# Patient Record
Sex: Female | Born: 1970 | Race: Black or African American | Hispanic: No | Marital: Single | Smoking: Never smoker
Health system: Southern US, Community
[De-identification: ages and names within clinical notes are randomized; demographics above are authoritative.]

## PROBLEM LIST (undated history)

## (undated) DIAGNOSIS — R519 Headache, unspecified: Secondary | ICD-10-CM

## (undated) DIAGNOSIS — D649 Anemia, unspecified: Secondary | ICD-10-CM

## (undated) DIAGNOSIS — R51 Headache: Secondary | ICD-10-CM

## (undated) DIAGNOSIS — J45909 Unspecified asthma, uncomplicated: Secondary | ICD-10-CM

## (undated) DIAGNOSIS — D219 Benign neoplasm of connective and other soft tissue, unspecified: Secondary | ICD-10-CM

## (undated) DIAGNOSIS — F419 Anxiety disorder, unspecified: Secondary | ICD-10-CM

## (undated) DIAGNOSIS — S4990XA Unspecified injury of shoulder and upper arm, unspecified arm, initial encounter: Secondary | ICD-10-CM

## (undated) HISTORY — DX: Benign neoplasm of connective and other soft tissue, unspecified: D21.9

## (undated) HISTORY — DX: Unspecified asthma, uncomplicated: J45.909

## (undated) HISTORY — PX: WISDOM TOOTH EXTRACTION: SHX21

---

## 1998-02-19 ENCOUNTER — Encounter: Admission: RE | Admit: 1998-02-19 | Discharge: 1998-02-19 | Payer: Self-pay | Admitting: *Deleted

## 1999-06-24 ENCOUNTER — Encounter: Payer: Self-pay | Admitting: Emergency Medicine

## 1999-06-24 ENCOUNTER — Emergency Department (HOSPITAL_COMMUNITY): Admission: EM | Admit: 1999-06-24 | Discharge: 1999-06-24 | Payer: Self-pay | Admitting: Emergency Medicine

## 1999-09-06 ENCOUNTER — Emergency Department (HOSPITAL_COMMUNITY): Admission: EM | Admit: 1999-09-06 | Discharge: 1999-09-06 | Payer: Self-pay | Admitting: Emergency Medicine

## 2000-08-22 ENCOUNTER — Emergency Department (HOSPITAL_COMMUNITY): Admission: EM | Admit: 2000-08-22 | Discharge: 2000-08-23 | Payer: Self-pay | Admitting: Emergency Medicine

## 2000-08-23 ENCOUNTER — Encounter: Payer: Self-pay | Admitting: Emergency Medicine

## 2001-05-15 ENCOUNTER — Encounter: Payer: Self-pay | Admitting: Emergency Medicine

## 2001-05-15 ENCOUNTER — Encounter: Admission: RE | Admit: 2001-05-15 | Discharge: 2001-05-15 | Payer: Self-pay | Admitting: Emergency Medicine

## 2002-06-13 ENCOUNTER — Other Ambulatory Visit: Admission: RE | Admit: 2002-06-13 | Discharge: 2002-06-13 | Payer: Self-pay | Admitting: *Deleted

## 2005-04-22 ENCOUNTER — Other Ambulatory Visit: Admission: RE | Admit: 2005-04-22 | Discharge: 2005-04-22 | Payer: Self-pay | Admitting: Obstetrics & Gynecology

## 2005-07-04 ENCOUNTER — Ambulatory Visit: Payer: Self-pay | Admitting: Family Medicine

## 2005-10-05 ENCOUNTER — Ambulatory Visit: Payer: Self-pay | Admitting: Family Medicine

## 2006-02-14 ENCOUNTER — Ambulatory Visit: Payer: Self-pay | Admitting: Family Medicine

## 2006-02-22 ENCOUNTER — Ambulatory Visit: Payer: Self-pay | Admitting: Family Medicine

## 2006-03-21 ENCOUNTER — Ambulatory Visit: Payer: Self-pay | Admitting: Family Medicine

## 2006-05-09 ENCOUNTER — Ambulatory Visit: Payer: Self-pay | Admitting: Family Medicine

## 2006-06-16 ENCOUNTER — Ambulatory Visit: Payer: Self-pay | Admitting: Family Medicine

## 2006-08-08 ENCOUNTER — Ambulatory Visit: Payer: Self-pay | Admitting: Family Medicine

## 2006-08-16 ENCOUNTER — Ambulatory Visit: Payer: Self-pay | Admitting: Family Medicine

## 2006-08-25 ENCOUNTER — Telehealth (INDEPENDENT_AMBULATORY_CARE_PROVIDER_SITE_OTHER): Payer: Self-pay | Admitting: *Deleted

## 2006-08-28 ENCOUNTER — Ambulatory Visit: Payer: Self-pay | Admitting: Family Medicine

## 2006-08-28 DIAGNOSIS — F329 Major depressive disorder, single episode, unspecified: Secondary | ICD-10-CM | POA: Insufficient documentation

## 2006-08-28 DIAGNOSIS — F411 Generalized anxiety disorder: Secondary | ICD-10-CM | POA: Insufficient documentation

## 2006-09-21 ENCOUNTER — Telehealth (INDEPENDENT_AMBULATORY_CARE_PROVIDER_SITE_OTHER): Payer: Self-pay | Admitting: Internal Medicine

## 2006-09-26 ENCOUNTER — Emergency Department: Payer: Self-pay | Admitting: Emergency Medicine

## 2006-09-26 ENCOUNTER — Other Ambulatory Visit: Payer: Self-pay

## 2006-09-29 ENCOUNTER — Ambulatory Visit: Payer: Self-pay | Admitting: Internal Medicine

## 2006-10-23 ENCOUNTER — Encounter: Payer: Self-pay | Admitting: Internal Medicine

## 2006-10-23 DIAGNOSIS — G5600 Carpal tunnel syndrome, unspecified upper limb: Secondary | ICD-10-CM | POA: Insufficient documentation

## 2006-11-15 ENCOUNTER — Encounter (INDEPENDENT_AMBULATORY_CARE_PROVIDER_SITE_OTHER): Payer: Self-pay | Admitting: Internal Medicine

## 2006-11-15 ENCOUNTER — Ambulatory Visit: Payer: Self-pay | Admitting: Family Medicine

## 2007-06-04 ENCOUNTER — Telehealth (INDEPENDENT_AMBULATORY_CARE_PROVIDER_SITE_OTHER): Payer: Self-pay | Admitting: Internal Medicine

## 2008-04-08 ENCOUNTER — Encounter (INDEPENDENT_AMBULATORY_CARE_PROVIDER_SITE_OTHER): Payer: Self-pay | Admitting: *Deleted

## 2009-04-25 HISTORY — PX: MYOMECTOMY ABDOMINAL APPROACH: SUR870

## 2009-05-15 ENCOUNTER — Emergency Department (HOSPITAL_COMMUNITY): Admission: EM | Admit: 2009-05-15 | Discharge: 2009-05-16 | Payer: Self-pay | Admitting: Emergency Medicine

## 2009-10-16 ENCOUNTER — Ambulatory Visit: Payer: Self-pay | Admitting: Obstetrics & Gynecology

## 2009-10-16 ENCOUNTER — Encounter (INDEPENDENT_AMBULATORY_CARE_PROVIDER_SITE_OTHER): Payer: Self-pay | Admitting: *Deleted

## 2009-10-16 LAB — CONVERTED CEMR LAB
CA 125: 15.5 units/mL (ref 0.0–30.2)
Pap Smear: NEGATIVE

## 2009-10-29 ENCOUNTER — Ambulatory Visit: Admission: RE | Admit: 2009-10-29 | Discharge: 2009-10-29 | Payer: Self-pay | Admitting: Gynecologic Oncology

## 2009-12-01 ENCOUNTER — Encounter: Payer: Self-pay | Admitting: Obstetrics & Gynecology

## 2009-12-01 ENCOUNTER — Inpatient Hospital Stay (HOSPITAL_COMMUNITY): Admission: RE | Admit: 2009-12-01 | Discharge: 2009-12-03 | Payer: Self-pay | Admitting: Obstetrics & Gynecology

## 2009-12-31 ENCOUNTER — Ambulatory Visit: Admission: RE | Admit: 2009-12-31 | Discharge: 2009-12-31 | Payer: Self-pay | Admitting: Gynecologic Oncology

## 2010-04-01 ENCOUNTER — Emergency Department (HOSPITAL_COMMUNITY): Admission: EM | Admit: 2010-04-01 | Discharge: 2009-09-12 | Payer: Self-pay | Admitting: Emergency Medicine

## 2010-07-09 LAB — TYPE AND SCREEN
ABO/RH(D): B POS
Antibody Screen: NEGATIVE

## 2010-07-09 LAB — COMPREHENSIVE METABOLIC PANEL
ALT: 19 U/L (ref 0–35)
Alkaline Phosphatase: 53 U/L (ref 39–117)
CO2: 24 mEq/L (ref 19–32)
Chloride: 108 mEq/L (ref 96–112)
Glucose, Bld: 93 mg/dL (ref 70–99)
Potassium: 4 mEq/L (ref 3.5–5.1)
Sodium: 139 mEq/L (ref 135–145)

## 2010-07-09 LAB — DIFFERENTIAL
Basophils Relative: 0 % (ref 0–1)
Eosinophils Relative: 3 % (ref 0–5)
Lymphocytes Relative: 41 % (ref 12–46)
Lymphs Abs: 3.6 10*3/uL (ref 0.7–4.0)
Lymphs Abs: 3.7 10*3/uL (ref 0.7–4.0)
Monocytes Relative: 5 % (ref 3–12)
Monocytes Relative: 6 % (ref 3–12)
Neutro Abs: 4.5 10*3/uL (ref 1.7–7.7)
Neutro Abs: 7.5 10*3/uL (ref 1.7–7.7)
Neutrophils Relative %: 61 % (ref 43–77)

## 2010-07-09 LAB — CA 125: CA 125: 12 U/mL (ref 0.0–30.2)

## 2010-07-09 LAB — CBC
HCT: 34.3 % — ABNORMAL LOW (ref 36.0–46.0)
Hemoglobin: 11.9 g/dL — ABNORMAL LOW (ref 12.0–15.0)
MCH: 29.2 pg (ref 26.0–34.0)
MCHC: 34.7 g/dL (ref 30.0–36.0)
MCHC: 34.8 g/dL (ref 30.0–36.0)
MCV: 84.9 fL (ref 78.0–100.0)
Platelets: 273 10*3/uL (ref 150–400)
Platelets: 289 10*3/uL (ref 150–400)
RDW: 14.7 % (ref 11.5–15.5)
WBC: 12.3 10*3/uL — ABNORMAL HIGH (ref 4.0–10.5)
WBC: 17 10*3/uL — ABNORMAL HIGH (ref 4.0–10.5)

## 2010-07-09 LAB — SURGICAL PCR SCREEN: MRSA, PCR: NEGATIVE

## 2010-07-09 LAB — PREGNANCY, URINE: Preg Test, Ur: NEGATIVE

## 2010-07-12 LAB — CBC
HCT: 38.6 % (ref 36.0–46.0)
Hemoglobin: 13.3 g/dL (ref 12.0–15.0)
MCHC: 34.6 g/dL (ref 30.0–36.0)
MCV: 85.2 fL (ref 78.0–100.0)
Platelets: 344 10*3/uL (ref 150–400)
RBC: 4.52 MIL/uL (ref 3.87–5.11)

## 2010-07-12 LAB — COMPREHENSIVE METABOLIC PANEL
Albumin: 3.3 g/dL — ABNORMAL LOW (ref 3.5–5.2)
BUN: 6 mg/dL (ref 6–23)
CO2: 24 mEq/L (ref 19–32)
Chloride: 107 mEq/L (ref 96–112)
GFR calc Af Amer: >60 mL/min (ref >60)
Glucose, Bld: 152 mg/dL — ABNORMAL HIGH (ref 70–99)
Potassium: 3.5 mEq/L (ref 3.5–5.1)

## 2010-07-12 LAB — URINALYSIS, ROUTINE W REFLEX MICROSCOPIC
Bilirubin Urine: NEGATIVE
Ketones, ur: NEGATIVE mg/dL
Leukocytes, UA: NEGATIVE
Nitrite: NEGATIVE
Urobilinogen, UA: 0.2 mg/dL (ref 0.0–1.0)
pH: 6.5 (ref 5.0–8.0)

## 2010-07-12 LAB — DIFFERENTIAL
Basophils Absolute: 0.1 10*3/uL (ref 0.0–0.1)
Eosinophils Absolute: 0.1 10*3/uL (ref 0.0–0.7)
Lymphs Abs: 2.9 10*3/uL (ref 0.7–4.0)
Monocytes Absolute: 1.2 10*3/uL — ABNORMAL HIGH (ref 0.1–1.0)
Neutro Abs: 13.7 10*3/uL — ABNORMAL HIGH (ref 1.7–7.7)
Neutrophils Relative %: 76 % (ref 43–77)

## 2010-07-12 LAB — URINE MICROSCOPIC-ADD ON

## 2010-10-10 IMAGING — CR DG CHEST 2V
2 series · 2 of 2 positions shown · non-contrast
Comparison: [HOSPITAL] chest x-ray report 08/23/2000.

CLINICAL DATA: Preop respiratory exam for pelvic mass.

CHEST - 2 VIEW

[w chest pa]
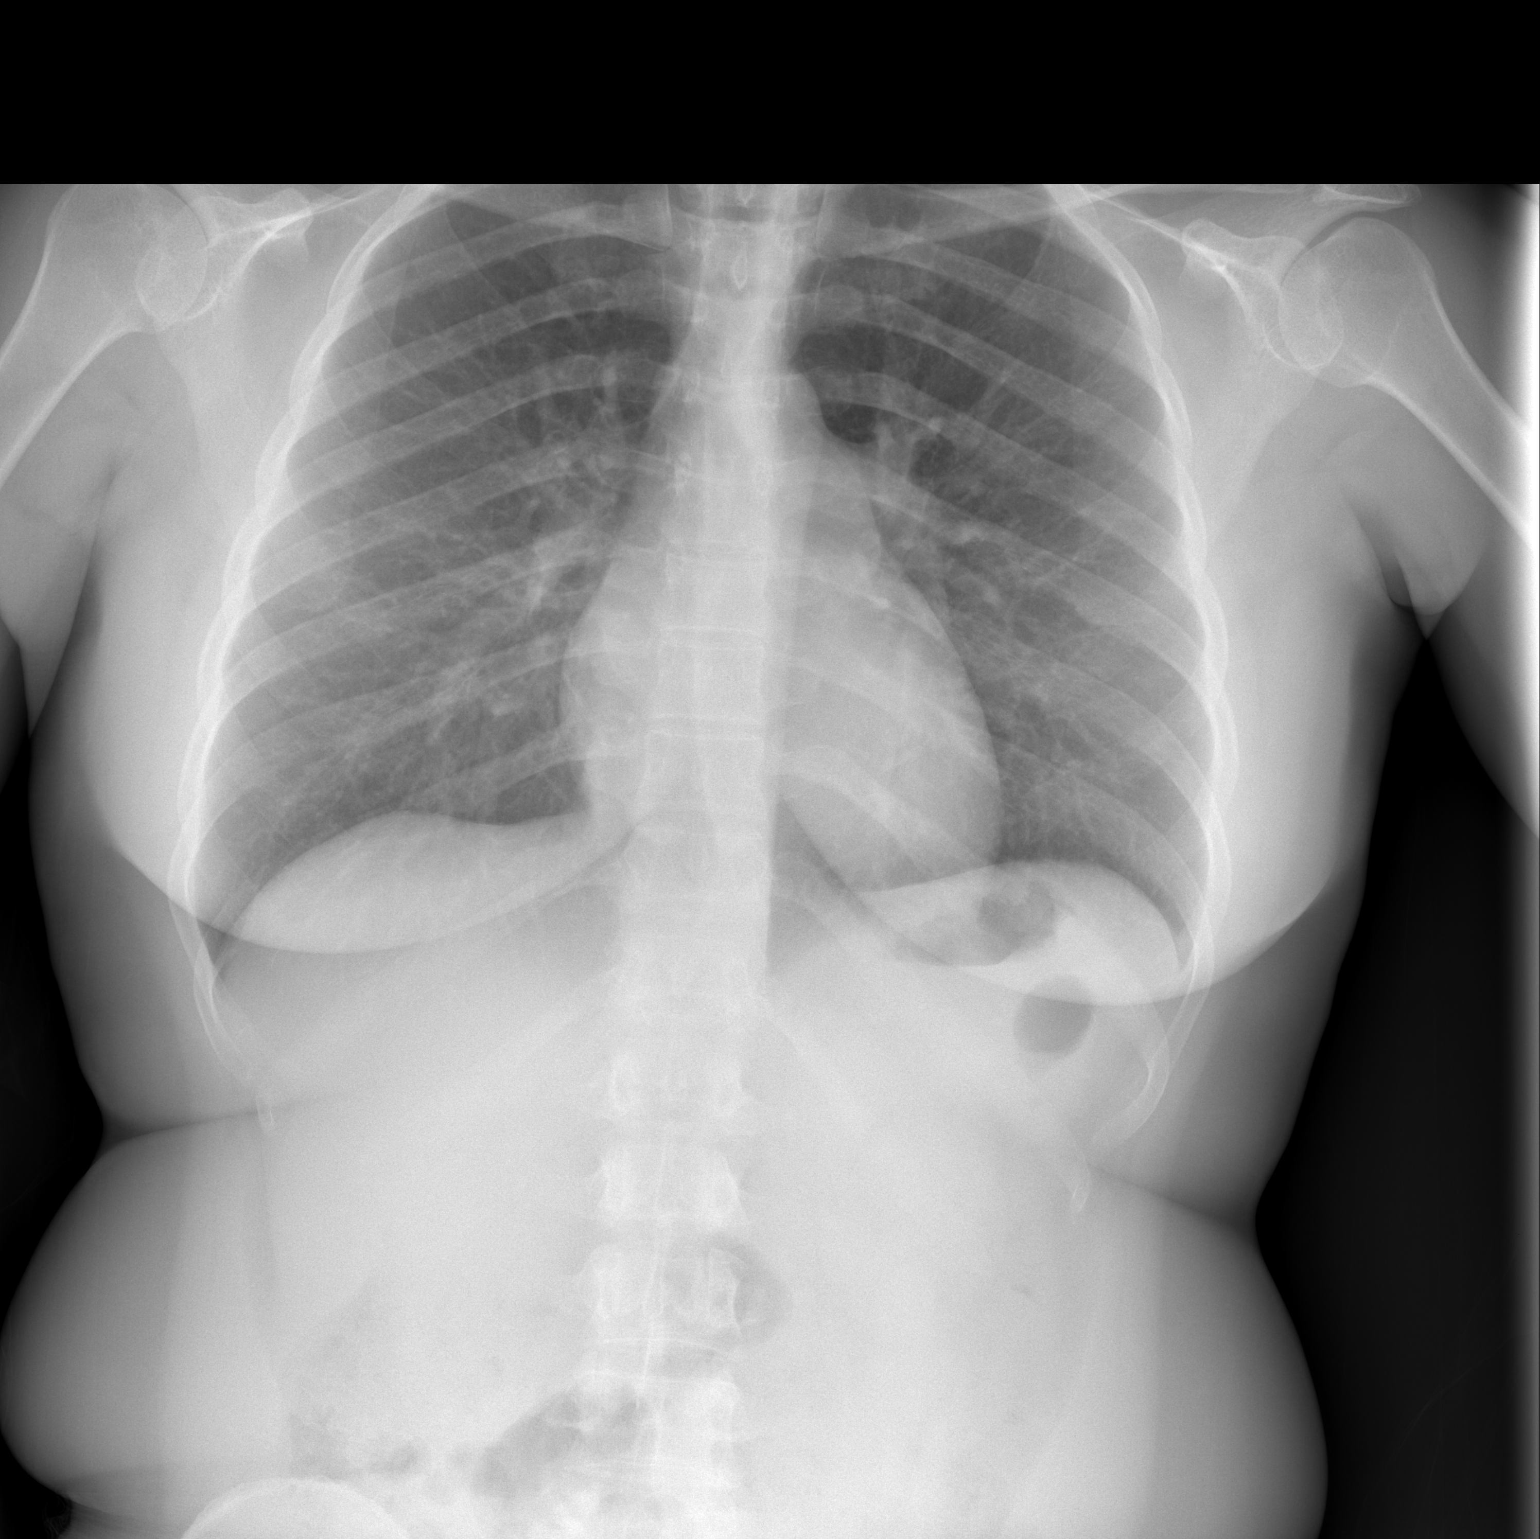

[w chest lat]
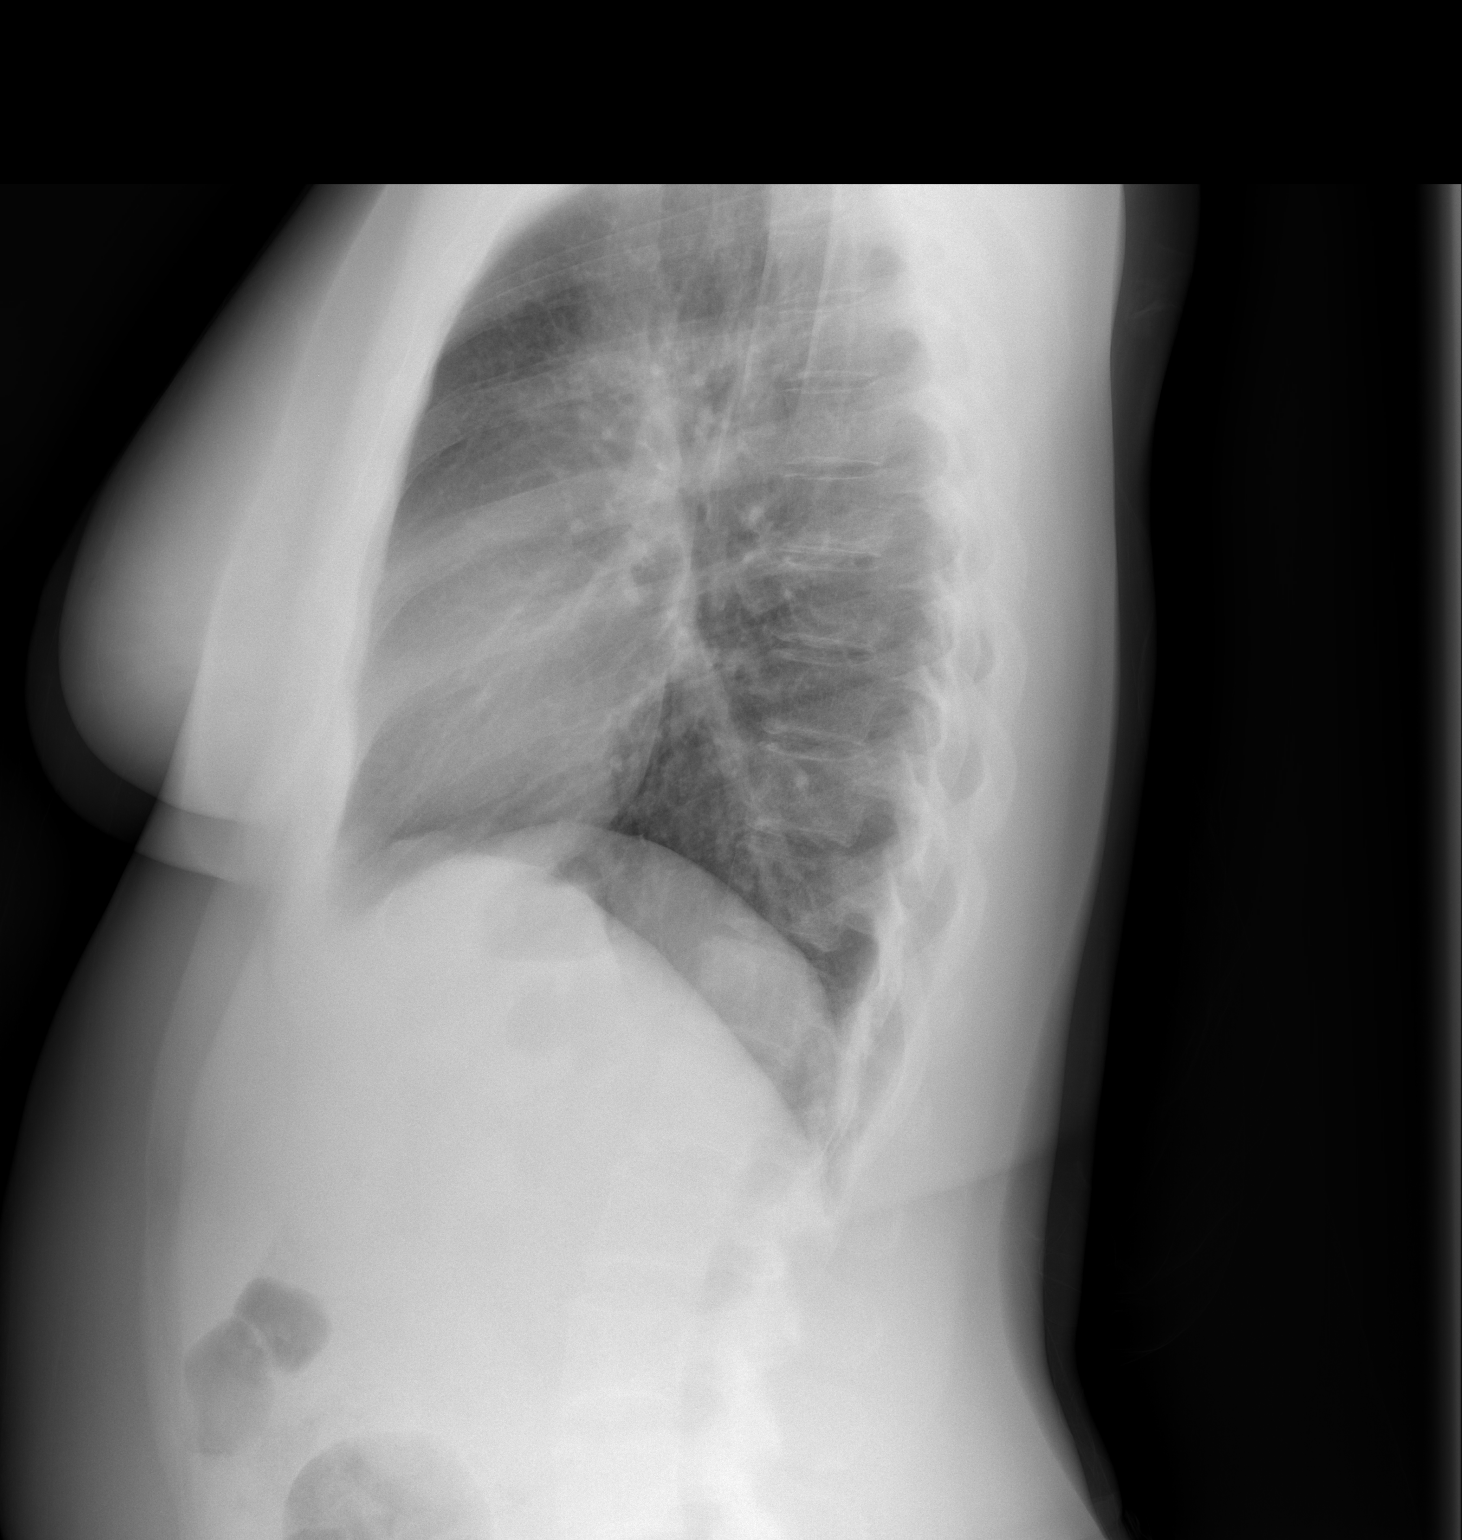

[2 of 2 positions shown; findings below may reference images not displayed]

FINDINGS: Lungs are clear.  Heart size normal.  Mediastinum, hila,
pleura and osseous structures appear normal.
IMPRESSION: Normal.

## 2011-01-16 ENCOUNTER — Emergency Department (HOSPITAL_COMMUNITY)
Admission: EM | Admit: 2011-01-16 | Discharge: 2011-01-16 | Disposition: A | Discharge status: Home / Self Care | Payer: Self-pay | Attending: Emergency Medicine | Admitting: Emergency Medicine

## 2011-01-16 DIAGNOSIS — W57XXXA Bitten or stung by nonvenomous insect and other nonvenomous arthropods, initial encounter: Secondary | ICD-10-CM | POA: Insufficient documentation

## 2011-01-16 DIAGNOSIS — T148 Other injury of unspecified body region: Secondary | ICD-10-CM | POA: Insufficient documentation

## 2011-01-16 DIAGNOSIS — L2989 Other pruritus: Secondary | ICD-10-CM | POA: Insufficient documentation

## 2011-01-16 DIAGNOSIS — L298 Other pruritus: Secondary | ICD-10-CM | POA: Insufficient documentation

## 2011-05-12 ENCOUNTER — Ambulatory Visit: Payer: Self-pay

## 2011-11-28 ENCOUNTER — Emergency Department (HOSPITAL_COMMUNITY): Payer: Self-pay

## 2011-11-28 ENCOUNTER — Encounter (HOSPITAL_COMMUNITY): Payer: Self-pay | Admitting: *Deleted

## 2011-11-28 ENCOUNTER — Emergency Department (HOSPITAL_COMMUNITY)
Admission: EM | Admit: 2011-11-28 | Discharge: 2011-11-28 | Disposition: A | Discharge status: Home Health | Payer: Self-pay | Attending: Emergency Medicine | Admitting: Emergency Medicine

## 2011-11-28 DIAGNOSIS — J40 Bronchitis, not specified as acute or chronic: Secondary | ICD-10-CM | POA: Insufficient documentation

## 2011-11-28 MED ORDER — HYDROCOD POLST-CHLORPHEN POLST 10-8 MG/5ML PO LQCR: 5.0000 mL | Freq: Every evening | ORAL | Status: DC | PRN

## 2011-11-28 MED ORDER — AZITHROMYCIN 250 MG PO TABS: 250.0000 mg | ORAL_TABLET | Freq: Every day | ORAL | Status: AC

## 2011-11-28 NOTE — ED Provider Notes (Signed)
History   This chart was scribed for Debra Bucco, MD by Shari Heritage. The patient was seen in room TR08C/TR08C. Patient's care was started at 1152.     CSN: [id number]  Arrival date & time 11/28/11  1152   First MD Initiated Contact with Patient 11/28/11 1338      Chief Complaint  Patient presents with  . Cough    (Consider location/radiation/quality/duration/timing/severity/associated sxs/prior treatment) The history is provided by the patient. No language interpreter was used.   Debra Hicks is a 41 y.o. Female` who presents to the Emergency Department complaining of persistent, cough with associated mild chest pain, wheezing, and rhinorrhea onset 2 weeks ago. Patient denies nausea, vomiting, diarrhea, urinary incontinence, or abdominal pain. Patient has taken Tylenol and Benadryl for symptoms with minimal relief. Patient has a surgical history of uterine fibroid surgery. She reports no other significant medical history. She has never smoked. Cough is dry, but feels rattling in chest.  No pleuritic pain.   Past Surgical History  Procedure Date  . Uterine fibroid surgery     No family history on file.  History  Substance Use Topics  . Smoking status: Never Smoker   . Smokeless tobacco: Not on file  . Alcohol Use: No    OB History    Grav Para Term Preterm Abortions TAB SAB Ect Mult Living                  Review of Systems  Constitutional: Positive for fatigue. Negative for fever, chills and diaphoresis.  HENT: Positive for congestion and rhinorrhea. Negative for sneezing.   Eyes: Negative.   Respiratory: Positive for cough, shortness of breath and wheezing. Negative for chest tightness.   Cardiovascular: Positive for chest pain. Negative for leg swelling.  Gastrointestinal: Negative for nausea, vomiting, abdominal pain, diarrhea and blood in stool.  Genitourinary: Negative for frequency, hematuria, flank pain and difficulty urinating.  Musculoskeletal:  Negative for back pain and arthralgias.  Skin: Negative for rash.  Neurological: Negative for dizziness, speech difficulty, weakness, numbness and headaches.    Allergies  Review of patient's allergies indicates no known allergies.  Home Medications   Current Outpatient Rx  Name Route Sig Dispense Refill  . DIPHENHYDRAMINE HCL 25 MG PO CAPS Oral Take 25 mg by mouth every 6 (six) hours as needed. For allergies    . TYLENOL COLD PO Oral Take 1 capsule by mouth 2 (two) times daily as needed. For cold symptoms    . AZITHROMYCIN 250 MG PO TABS Oral Take 1 tablet (250 mg total) by mouth daily. Take first 2 tablets together, then 1 every day until finished. 6 tablet 0  . HYDROCOD POLST-CPM POLST ER 10-8 MG/5ML PO LQCR Oral Take 5 mLs by mouth at bedtime as needed. 115 mL 0    BP 126/90  Pulse 73  Temp 98.8 F (37.1 C) (Oral)  Resp 18  SpO2 99%  LMP 10/31/2011  Physical Exam  Constitutional: She is oriented to person, place, and time. She appears well-developed and well-nourished.  HENT:  Head: Normocephalic and atraumatic.  Right Ear: External ear normal.  Left Ear: External ear normal.  Mouth/Throat: Oropharynx is clear and moist.  Eyes: Pupils are equal, round, and reactive to light.  Neck: Normal range of motion. Neck supple.  Cardiovascular: Normal rate, regular rhythm and normal heart sounds.   Pulmonary/Chest: Effort normal and breath sounds normal. No respiratory distress. She has no wheezes. She has no rales. She exhibits no  tenderness.  Abdominal: Soft. Bowel sounds are normal. There is no tenderness. There is no rebound and no guarding.  Musculoskeletal: Normal range of motion. She exhibits no edema.  Lymphadenopathy:    She has no cervical adenopathy.  Neurological: She is alert and oriented to person, place, and time.  Skin: Skin is warm and dry. No rash noted.  Psychiatric: She has a normal mood and affect.    ED Course  Procedures (including critical care  time) DIAGNOSTIC STUDIES: Oxygen Saturation is 99% on room air, normal by my interpretation.    COORDINATION OF CARE: 1:43pm- Patient informed of current plan for treatment and evaluation and agrees with plan at this time.   Dg Chest 2 View  11/28/2011  *RADIOLOGY REPORT*  Clinical Data: Cough, wheezing, chest congestion.  CHEST - 2 VIEW  Comparison: 11/27/2009  Findings: Heart and mediastinal contours are within normal limits. No focal opacities or effusions.  No acute bony abnormality.  IMPRESSION: No active cardiopulmonary disease.  Original Report Authenticated By: Cyndie Chime, M.D.    1. Bronchitis       MDM  Will start on zithromax, tussionex cough syrup.  F/u with PCP is symptoms not improving.      I personally performed the services described in this documentation, which was scribed in my presence.  The recorded information has been reviewed and considered.    Debra Bucco, MD 11/28/11 [phone]

## 2011-11-28 NOTE — ED Notes (Signed)
Pt is here with wheezing and coughing and states breathing distorted.  Pt in nad

## 2011-12-19 ENCOUNTER — Emergency Department (HOSPITAL_COMMUNITY)
Admission: EM | Admit: 2011-12-19 | Discharge: 2011-12-19 | Disposition: A | Discharge status: Home / Self Care | Payer: No Typology Code available for payment source | Attending: Emergency Medicine | Admitting: Emergency Medicine

## 2011-12-19 ENCOUNTER — Encounter (HOSPITAL_COMMUNITY): Payer: Self-pay

## 2011-12-19 ENCOUNTER — Emergency Department (HOSPITAL_COMMUNITY): Payer: No Typology Code available for payment source

## 2011-12-19 DIAGNOSIS — S139XXA Sprain of joints and ligaments of unspecified parts of neck, initial encounter: Secondary | ICD-10-CM

## 2011-12-19 DIAGNOSIS — Z043 Encounter for examination and observation following other accident: Secondary | ICD-10-CM | POA: Insufficient documentation

## 2011-12-19 MED ORDER — IBUPROFEN 800 MG PO TABS
800.0000 mg | ORAL_TABLET | Freq: Once | ORAL | Status: AC
  Administered 2011-12-19: 800 mg via ORAL
  Filled 2011-12-19: qty 1

## 2011-12-19 MED ORDER — OXYCODONE-ACETAMINOPHEN 5-325 MG PO TABS: 2.0000 | ORAL_TABLET | ORAL | Status: AC | PRN

## 2011-12-19 MED ORDER — OXYCODONE-ACETAMINOPHEN 5-325 MG PO TABS
2.0000 | ORAL_TABLET | Freq: Once | ORAL | Status: AC
  Administered 2011-12-19: 2 via ORAL
  Filled 2011-12-19: qty 2

## 2011-12-19 MED ORDER — IBUPROFEN 600 MG PO TABS: 600.0000 mg | ORAL_TABLET | Freq: Four times a day (QID) | ORAL | Status: AC | PRN
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
## 2011-12-27 ENCOUNTER — Emergency Department (HOSPITAL_COMMUNITY)
Admission: EM | Admit: 2011-12-27 | Discharge: 2011-12-27 | Disposition: A | Discharge status: Home / Self Care | Payer: No Typology Code available for payment source | Attending: Emergency Medicine | Admitting: Emergency Medicine

## 2011-12-27 ENCOUNTER — Encounter (HOSPITAL_COMMUNITY): Payer: Self-pay | Admitting: Emergency Medicine

## 2011-12-27 DIAGNOSIS — Z0279 Encounter for issue of other medical certificate: Secondary | ICD-10-CM | POA: Insufficient documentation

## 2011-12-27 DIAGNOSIS — R51 Headache: Secondary | ICD-10-CM | POA: Insufficient documentation

## 2011-12-27 DIAGNOSIS — M542 Cervicalgia: Secondary | ICD-10-CM | POA: Insufficient documentation

## 2011-12-27 DIAGNOSIS — T148XXA Other injury of unspecified body region, initial encounter: Secondary | ICD-10-CM

## 2011-12-27 DIAGNOSIS — Z760 Encounter for issue of repeat prescription: Secondary | ICD-10-CM | POA: Insufficient documentation

## 2011-12-27 MED ORDER — NAPROXEN 250 MG PO TABS: 250.0000 mg | ORAL_TABLET | Freq: Two times a day (BID) | ORAL | Status: DC

## 2011-12-27 MED ORDER — METHOCARBAMOL 500 MG PO TABS: 1000.0000 mg | ORAL_TABLET | Freq: Four times a day (QID) | ORAL | Status: AC | PRN

## 2011-12-27 MED ORDER — OXYCODONE-ACETAMINOPHEN 5-325 MG PO TABS: ORAL_TABLET | ORAL | Status: AC
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
## 2011-12-27 NOTE — ED Notes (Signed)
Patient wants to leave.  States that she does not want to wait for her CT scan even though she is next on the list.  MD notified.

## 2012-11-18 ENCOUNTER — Encounter (HOSPITAL_COMMUNITY): Payer: Self-pay | Admitting: Emergency Medicine

## 2012-11-18 ENCOUNTER — Emergency Department (HOSPITAL_COMMUNITY)
Admission: EM | Admit: 2012-11-18 | Discharge: 2012-11-18 | Disposition: A | Discharge status: Home / Self Care | Payer: Self-pay | Attending: Emergency Medicine | Admitting: Emergency Medicine

## 2012-11-18 DIAGNOSIS — M2540 Effusion, unspecified joint: Secondary | ICD-10-CM | POA: Insufficient documentation

## 2012-11-18 DIAGNOSIS — M25519 Pain in unspecified shoulder: Secondary | ICD-10-CM | POA: Insufficient documentation

## 2012-11-18 DIAGNOSIS — M7989 Other specified soft tissue disorders: Secondary | ICD-10-CM

## 2012-11-18 DIAGNOSIS — M25561 Pain in right knee: Secondary | ICD-10-CM

## 2012-11-18 DIAGNOSIS — M79609 Pain in unspecified limb: Secondary | ICD-10-CM

## 2012-11-18 MED ORDER — TRAMADOL HCL 50 MG PO TABS: 50.0000 mg | ORAL_TABLET | Freq: Four times a day (QID) | ORAL | Status: DC | PRN

## 2012-11-18 MED ORDER — HYDROCODONE-ACETAMINOPHEN 5-325 MG PO TABS
2.0000 | ORAL_TABLET | Freq: Once | ORAL | Status: AC
  Administered 2012-11-18: 2 via ORAL
  Filled 2012-11-18: qty 2

## 2012-11-18 NOTE — Progress Notes (Signed)
Orthopedic Tech Progress Note Patient Details:  Debra Hicks Sep 20, 1970 [id number]  Ortho Devices Type of Ortho Device: Crutches;Knee Sleeve Ortho Device/Splint Interventions: Application   Cammer, Mickie Bail 11/18/2012, 12:45 PM

## 2012-11-18 NOTE — ED Notes (Signed)
Pt c/o right leg pain x 3 days. Pt denies injury, traveling or change in activity.

## 2012-11-18 NOTE — ED Provider Notes (Signed)
Medical screening examination/treatment/procedure(s) were performed by non-physician practitioner and as supervising physician I was immediately available for consultation/collaboration.   Lyanne Co, MD 11/18/12 1230
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
## 2012-11-18 NOTE — Progress Notes (Signed)
VASCULAR LAB PRELIMINARY  PRELIMINARY  PRELIMINARY  PRELIMINARY  Right lower extremity venous Doppler completed.    Preliminary report:  There is no DVT or SVT noted in the right lower extremity.  Skyylar Kopf, RVT 11/18/2012, 12:20 PM

## 2012-11-24 ENCOUNTER — Emergency Department (HOSPITAL_COMMUNITY)
Admission: EM | Admit: 2012-11-24 | Discharge: 2012-11-24 | Disposition: A | Discharge status: Home / Self Care | Payer: Self-pay | Attending: Emergency Medicine | Admitting: Emergency Medicine

## 2012-11-24 ENCOUNTER — Emergency Department (HOSPITAL_COMMUNITY): Payer: Self-pay

## 2012-11-24 ENCOUNTER — Encounter (HOSPITAL_COMMUNITY): Payer: Self-pay | Admitting: Emergency Medicine

## 2012-11-24 DIAGNOSIS — M25561 Pain in right knee: Secondary | ICD-10-CM

## 2012-11-24 DIAGNOSIS — M25569 Pain in unspecified knee: Secondary | ICD-10-CM | POA: Insufficient documentation

## 2012-11-24 DIAGNOSIS — M7989 Other specified soft tissue disorders: Secondary | ICD-10-CM | POA: Insufficient documentation

## 2012-11-24 MED ORDER — TRAMADOL HCL 50 MG PO TABS: 50.0000 mg | ORAL_TABLET | Freq: Four times a day (QID) | ORAL | Status: DC | PRN

## 2012-11-24 NOTE — ED Provider Notes (Signed)
CSN: [id number]     Arrival date & time 11/24/12  0825 History     First MD Initiated Contact with Patient 11/24/12 0830     Chief Complaint  Patient presents with  . Leg Swelling  . Leg Pain   (Consider location/radiation/quality/duration/timing/severity/associated sxs/prior Treatment) HPI Comments: 42 year old female presents to the emergency department complaining of right knee pain continuing after being seen in the emergency department 6 days ago. At that time she had a Doppler ultrasound to rule out a DVT, ultrasound negative for DVT. She was given a knee sleeve and crutches and advised to take ibuprofen, however she has not had any relief. She was also advised to followup with orthopedics, however states her insurance company will not cover of this at this time, she is working to get this issue fixed. States her knee is more swollen and feels "pulling" behind her knee. Pain rated 8/10, has been taking tramadol, flexeril and ibuprofen with minimal relief. Denies fever, chills, color changes.  Patient is a 42 y.o. female presenting with leg pain. The history is provided by the patient.  Leg Pain Associated symptoms: no fever     History reviewed. No pertinent past medical history. Past Surgical History  Procedure Laterality Date  . Uterine fibroid surgery     No family history on file. History  Substance Use Topics  . Smoking status: Never Smoker   . Smokeless tobacco: Not on file  . Alcohol Use: No   OB History   Grav Para Term Preterm Abortions TAB SAB Ect Mult Living                 Review of Systems  Constitutional: Negative for fever and chills.  Musculoskeletal: Positive for joint swelling.       Positive for right knee pain.  Skin: Negative for color change.  All other systems reviewed and are negative.    Allergies  Review of patient's allergies indicates no known allergies.  Home Medications   Current Outpatient Rx  Name  Route  Sig  Dispense  Refill   . ALPRAZolam (XANAX) 1 MG tablet   Oral   Take 1 mg by mouth daily as needed for anxiety.         Marland Kitchen oxyCODONE-acetaminophen (PERCOCET/ROXICET) 5-325 MG per tablet   Oral   Take 1 tablet by mouth every 4 (four) hours as needed for pain.         . traMADol (ULTRAM) 50 MG tablet   Oral   Take 1 tablet (50 mg total) by mouth every 6 (six) hours as needed for pain.   15 tablet   0   . zolpidem (AMBIEN) 10 MG tablet   Oral   Take 10 mg by mouth at bedtime as needed for sleep.          BP 119/78  Pulse 75  Temp(Src) 98.6 F (37 C) (Oral)  Resp 18  Ht 5' 2.5" (1.588 m)  Wt 147 lb (66.679 kg)  BMI 26.44 kg/m2  SpO2 100%  LMP 11/24/2012 Physical Exam  Nursing note and vitals reviewed. Constitutional: She is oriented to person, place, and time. She appears well-developed and well-nourished. No distress.  HENT:  Head: Normocephalic and atraumatic.  Mouth/Throat: Oropharynx is clear and moist.  Eyes: Conjunctivae are normal.  Neck: Normal range of motion. Neck supple.  Cardiovascular: Normal rate, regular rhythm and normal heart sounds.   Pulses:      Dorsalis pedis pulses are 2+ on the  right side.       Posterior tibial pulses are 2+ on the right side.  No pitting edema.  Pulmonary/Chest: Effort normal and breath sounds normal.  Musculoskeletal: Normal range of motion. She exhibits no edema.  Right knee TTP at medial joint line. Mild edema medially. Full ROM. No ligamentous laxity appreciated. Calf sizes equal bilateral.  Neurological: She is alert and oriented to person, place, and time.  Skin: Skin is warm and dry. She is not diaphoretic.  Psychiatric: She has a normal mood and affect. Her behavior is normal.    ED Course   Procedures (including critical care time)  Labs Reviewed - No data to display Dg Knee Complete 4 Views Right  11/24/2012   *RADIOLOGY REPORT*  Clinical Data: Fall.  Posterior knee pain.  RIGHT KNEE - COMPLETE 4+ VIEW  Comparison: None   Findings: Minimal spurring in the patellofemoral compartment. No acute bony abnormality.  Specifically, no fracture, subluxation, or dislocation.  Soft tissues are intact. No joint effusion.  Joint spaces are maintained.  IMPRESSION: No acute bony abnormality.   Original Report Authenticated By: Charlett Nose, M.D.   1. Knee pain, right     MDM  Patient with knee pain, had DVT ruled out 6 days ago. Probable meniscal injury. Point tenderness at medial joint line. Xray normal. Advised continue knee sleeve, ibuprofen, f/u with ortho. Patient states understanding of plan and is agreeable.   Trevor Mace, PA-C 11/24/12 1000

## 2012-11-24 NOTE — ED Notes (Signed)
Patient requested tramadol prescription PA notified. Patient stated only has three tablets left and asked to speak with EDP. EDP spoke with patient with nurse at bedside.  Prescription given to patient.

## 2012-11-24 NOTE — ED Provider Notes (Signed)
Medical screening examination/treatment/procedure(s) were performed by non-physician practitioner and as supervising physician I was immediately available for consultation/collaboration.   Cornie Mccomber B. Bernette Mayers, MD 11/24/12 1003

## 2012-11-24 NOTE — ED Notes (Signed)
Patient states she was seen here for leg pain on Sunday.   Patient states she's been wearing a knee sleeve since then with crutches.   Patient states that her knee and feet are swelling worse at this time and she was concerned of a blood clot.

## 2013-05-31 ENCOUNTER — Emergency Department (HOSPITAL_COMMUNITY): Payer: Self-pay

## 2013-05-31 ENCOUNTER — Encounter (HOSPITAL_COMMUNITY): Payer: Self-pay | Admitting: Emergency Medicine

## 2013-05-31 ENCOUNTER — Emergency Department (HOSPITAL_COMMUNITY)
Admission: EM | Admit: 2013-05-31 | Discharge: 2013-05-31 | Disposition: A | Discharge status: Home / Self Care | Payer: Self-pay | Attending: Emergency Medicine | Admitting: Emergency Medicine

## 2013-05-31 DIAGNOSIS — R0602 Shortness of breath: Secondary | ICD-10-CM | POA: Insufficient documentation

## 2013-05-31 DIAGNOSIS — D649 Anemia, unspecified: Secondary | ICD-10-CM | POA: Insufficient documentation

## 2013-05-31 DIAGNOSIS — R05 Cough: Secondary | ICD-10-CM | POA: Insufficient documentation

## 2013-05-31 DIAGNOSIS — R110 Nausea: Secondary | ICD-10-CM | POA: Insufficient documentation

## 2013-05-31 DIAGNOSIS — Z87828 Personal history of other (healed) physical injury and trauma: Secondary | ICD-10-CM | POA: Insufficient documentation

## 2013-05-31 DIAGNOSIS — R059 Cough, unspecified: Secondary | ICD-10-CM | POA: Insufficient documentation

## 2013-05-31 DIAGNOSIS — R197 Diarrhea, unspecified: Secondary | ICD-10-CM | POA: Insufficient documentation

## 2013-05-31 DIAGNOSIS — Z79899 Other long term (current) drug therapy: Secondary | ICD-10-CM | POA: Insufficient documentation

## 2013-05-31 DIAGNOSIS — Z9889 Other specified postprocedural states: Secondary | ICD-10-CM | POA: Insufficient documentation

## 2013-05-31 DIAGNOSIS — J329 Chronic sinusitis, unspecified: Secondary | ICD-10-CM | POA: Insufficient documentation

## 2013-05-31 LAB — PRO B NATRIURETIC PEPTIDE: PRO B NATRI PEPTIDE: 274.9 pg/mL — AB (ref 0–125)

## 2013-05-31 LAB — CBC
HCT: 30.4 % — ABNORMAL LOW (ref 36.0–46.0)
HEMOGLOBIN: 10.6 g/dL — AB (ref 12.0–15.0)
MCH: 25.6 pg — AB (ref 26.0–34.0)
MCHC: 34.9 g/dL (ref 30.0–36.0)
MCV: 73.4 fL — AB (ref 78.0–100.0)
Platelets: 389 10*3/uL (ref 150–400)
RBC: 4.14 MIL/uL (ref 3.87–5.11)
RDW: 15.1 % (ref 11.5–15.5)
WBC: 7.8 10*3/uL (ref 4.0–10.5)

## 2013-05-31 LAB — BASIC METABOLIC PANEL
BUN: 10 mg/dL (ref 6–23)
CALCIUM: 9.1 mg/dL (ref 8.4–10.5)
CHLORIDE: 108 meq/L (ref 96–112)
CO2: 22 meq/L (ref 19–32)
Creatinine, Ser: 0.93 mg/dL (ref 0.50–1.10)
GFR calc Af Amer: 87 mL/min — ABNORMAL LOW (ref >90)
GFR calc non Af Amer: 75 mL/min — ABNORMAL LOW (ref >90)
GLUCOSE: 73 mg/dL (ref 70–99)
Potassium: 3.8 mEq/L (ref 3.7–5.3)
SODIUM: 143 meq/L (ref 137–147)

## 2013-05-31 LAB — POCT I-STAT TROPONIN I: TROPONIN I, POC: 0.01 ng/mL (ref 0.00–0.08)

## 2013-05-31 MED ORDER — AMOXICILLIN-POT CLAVULANATE 500-125 MG PO TABS: 1.0000 | ORAL_TABLET | Freq: Three times a day (TID) | ORAL | Status: DC

## 2013-05-31 NOTE — ED Notes (Signed)
Pt presents with central chest pain that started 2 days ago, denies radiation of pain- worse with a deep breath.  Admits to nausea, denies vomiting or diarrhea.  Denies having similar pain before.  Currently speaking in full sentences, no distress noted at present.

## 2013-05-31 NOTE — ED Notes (Signed)
Dr Beaton at bedside 

## 2013-05-31 NOTE — ED Notes (Signed)
Reports intermittent sharp pain in center of chest that radiates to back x 2 weeks with sob, non-productive cough, diarrhea, and nausea.

## 2013-05-31 NOTE — Discharge Instructions (Signed)
Iron Deficiency Anemia, Adult Anemia is when you have a low number of healthy red blood cells. It is often caused by too little iron. This is called iron deficiency anemia. It may make you tired and short of breath. HOME CARE   Take iron as told by your doctor.  Take vitamins as told by your doctor.  Eat foods that have iron in them. This includes liver, lean beef, whole-grain bread, eggs, dried fruit, and dark green leafy vegetables. GET HELP RIGHT AWAY IF:  You pass out (faint).  You have chest pain.  You feel sick to your stomach (nauseous) or throw up (vomit).  You get very short of breath with activity.  You are weak.  You have a fast heartbeat.  You start to sweat for no reason.  You become lightheaded when getting up from a chair or bed. MAKE SURE YOU:  Understand these instructions.  Will watch your condition.  Will get help right away if you are not doing well or get worse. Document Released: 05/14/2010 Document Revised: 01/30/2013 Document Reviewed: 12/17/2012 [facility] Patient Information 2014 [place].  Sinusitis Sinusitis is redness, soreness, and puffiness (inflammation) of the air pockets in the bones of your face (sinuses). The redness, soreness, and puffiness can cause air and mucus to get trapped in your sinuses. This can allow germs to grow and cause an infection.  HOME CARE   Drink enough fluids to keep your pee (urine) clear or pale yellow.  Use a humidifier in your home.  Run a hot shower to create steam in the bathroom. Sit in the bathroom with the door closed. Breathe in the steam 3 4 times a day.  Put a warm, moist washcloth on your face 3 4 times a day, or as told by your doctor.  Use salt water sprays (saline sprays) to wet the thick fluid in your nose. This can help the sinuses drain.  Only take medicine as told by your doctor. GET HELP RIGHT AWAY IF:   Your pain gets worse.  You have very bad headaches.  You are sick to  your stomach (nauseous).  You throw up (vomit).  You are very sleepy (drowsy) all the time.  Your face is puffy (swollen).  Your vision changes.  You have a stiff neck.  You have trouble breathing. MAKE SURE YOU:   Understand these instructions.  Will watch your condition.  Will get help right away if you are not doing well or get worse. Document Released: 09/28/2007 Document Revised: 01/04/2012 Document Reviewed: 11/15/2011 [facility] Patient Information 2014 [place].  Anemia, Nonspecific Anemia is a condition in which the concentration of red blood cells or hemoglobin in the blood is below normal. Hemoglobin is a substance in red blood cells that carries oxygen to the tissues of the body. Anemia results in not enough oxygen reaching these tissues.  CAUSES  Common causes of anemia include:   Excessive bleeding. Bleeding may be internal or external. This includes excessive bleeding from periods (in women) or from the intestine.   Poor nutrition.   Chronic kidney, thyroid, and liver disease.  Bone marrow disorders that decrease red blood cell production.  Cancer and treatments for cancer.  HIV, AIDS, and their treatments.  Spleen problems that increase red blood cell destruction.  Blood disorders.  Excess destruction of red blood cells due to infection, medicines, and autoimmune disorders. SIGNS AND SYMPTOMS   Minor weakness.   Dizziness.   Headache.  Palpitations.   Shortness of  breath, especially with exercise.   Paleness.  Cold sensitivity.  Indigestion.  Nausea.  Difficulty sleeping.  Difficulty concentrating. Symptoms may occur suddenly or they may develop slowly.  DIAGNOSIS  Additional blood tests are often needed. These help your health care provider determine the best treatment. Your health care provider will check your stool for blood and look for other causes of blood loss.  TREATMENT  Treatment varies depending on the  cause of the anemia. Treatment can include:   Supplements of iron, vitamin J19, or folic acid.   Hormone medicines.   A blood transfusion. This may be needed if blood loss is severe.   Hospitalization. This may be needed if there is significant continual blood loss.   Dietary changes.  Spleen removal. HOME CARE INSTRUCTIONS Keep all follow-up appointments. It often takes many weeks to correct anemia, and having your health care provider check on your condition and your response to treatment is very important. SEEK IMMEDIATE MEDICAL CARE IF:   You develop extreme weakness, shortness of breath, or chest pain.   You become dizzy or have trouble concentrating.  You develop heavy vaginal bleeding.   You develop a rash.   You have bloody or black, tarry stools.   You faint.   You vomit up blood.   You vomit repeatedly.   You have abdominal pain.  You have a fever or persistent symptoms for more than 2 3 days.   You have a fever and your symptoms suddenly get worse.   You are dehydrated.  MAKE SURE YOU:  Understand these instructions.  Will watch your condition.  Will get help right away if you are not doing well or get worse. Document Released: 05/19/2004 Document Revised: 12/12/2012 Document Reviewed: 10/05/2012 [facility] Patient Information 2014 [place].
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
## 2013-06-14 ENCOUNTER — Emergency Department (HOSPITAL_COMMUNITY)
Admission: EM | Admit: 2013-06-14 | Discharge: 2013-06-14 | Disposition: A | Discharge status: Home / Self Care | Payer: Self-pay | Attending: Emergency Medicine | Admitting: Emergency Medicine

## 2013-06-14 ENCOUNTER — Encounter (HOSPITAL_COMMUNITY): Payer: Self-pay | Admitting: Emergency Medicine

## 2013-06-14 ENCOUNTER — Emergency Department (HOSPITAL_COMMUNITY): Payer: Self-pay

## 2013-06-14 DIAGNOSIS — D649 Anemia, unspecified: Secondary | ICD-10-CM | POA: Insufficient documentation

## 2013-06-14 DIAGNOSIS — R471 Dysarthria and anarthria: Secondary | ICD-10-CM | POA: Insufficient documentation

## 2013-06-14 DIAGNOSIS — R2990 Unspecified symptoms and signs involving the nervous system: Secondary | ICD-10-CM

## 2013-06-14 DIAGNOSIS — G459 Transient cerebral ischemic attack, unspecified: Secondary | ICD-10-CM | POA: Insufficient documentation

## 2013-06-14 DIAGNOSIS — Z3202 Encounter for pregnancy test, result negative: Secondary | ICD-10-CM | POA: Insufficient documentation

## 2013-06-14 DIAGNOSIS — N898 Other specified noninflammatory disorders of vagina: Secondary | ICD-10-CM | POA: Insufficient documentation

## 2013-06-14 DIAGNOSIS — Z79899 Other long term (current) drug therapy: Secondary | ICD-10-CM | POA: Insufficient documentation

## 2013-06-14 LAB — PREGNANCY, URINE: Preg Test, Ur: NEGATIVE

## 2013-06-14 LAB — CBC
HEMATOCRIT: 32.9 % — AB (ref 36.0–46.0)
Hemoglobin: 11.4 g/dL — ABNORMAL LOW (ref 12.0–15.0)
MCH: 26 pg (ref 26.0–34.0)
MCHC: 34.7 g/dL (ref 30.0–36.0)
MCV: 75.1 fL — ABNORMAL LOW (ref 78.0–100.0)
PLATELETS: 389 10*3/uL (ref 150–400)
RBC: 4.38 MIL/uL (ref 3.87–5.11)
RDW: 16.6 % — AB (ref 11.5–15.5)
WBC: 8 10*3/uL (ref 4.0–10.5)

## 2013-06-14 LAB — COMPREHENSIVE METABOLIC PANEL
ALBUMIN: 3.5 g/dL (ref 3.5–5.2)
ALT: 20 U/L (ref 0–35)
AST: 16 U/L (ref 0–37)
Alkaline Phosphatase: 59 U/L (ref 39–117)
BUN: 10 mg/dL (ref 6–23)
CO2: 26 mEq/L (ref 19–32)
CREATININE: 0.78 mg/dL (ref 0.50–1.10)
Calcium: 9.4 mg/dL (ref 8.4–10.5)
Chloride: 109 mEq/L (ref 96–112)
GFR calc Af Amer: >90 mL/min (ref >90)
GFR calc non Af Amer: >90 mL/min (ref >90)
Glucose, Bld: 88 mg/dL (ref 70–99)
Potassium: 4.7 mEq/L (ref 3.7–5.3)
Sodium: 145 mEq/L (ref 137–147)
TOTAL PROTEIN: 7.2 g/dL (ref 6.0–8.3)
Total Bilirubin: 0.2 mg/dL — ABNORMAL LOW (ref 0.3–1.2)

## 2013-06-14 NOTE — Discharge Instructions (Signed)
For transient slurred speech, follow up with neurologist in the next 1-2 weeks - see referral - call office to arrange appointment.  Your blood count is low, but similar to prior results (hemoglobin 11) - continue iron, and follow up with primary care doctor in the next few weeks. Return to ER right away if worse, symptoms recur, change in speech or vision, one-sided numbness/weakness, other concern.      Transient Ischemic Attack A transient ischemic attack (TIA) is a "warning stroke" that causes stroke-like symptoms. Unlike a stroke, a TIA does not cause permanent damage to the brain. The symptoms of a TIA can happen very fast and do not last long. It is important to know the symptoms of a TIA and what to do. This can help prevent a major stroke or death. CAUSES   A TIA is caused by a temporary blockage in an artery in the brain or neck (carotid artery). The blockage does not allow the brain to get the blood supply it needs and can cause different symptoms. The blockage can be caused by either:  A blood clot.  Fatty buildup (plaque) in a neck or brain artery. RISK FACTORS  High blood pressure (hypertension).  High cholesterol.  Diabetes mellitus.  Heart disease.  The build up of plaque in the blood vessels (peripheral artery disease or atherosclerosis).  The build up of plaque in the blood vessels providing blood and oxygen to the brain (carotid artery stenosis).  An abnormal heart rhythm (atrial fibrillation).  Obesity.  Smoking.  Taking oral contraceptives (especially in combination with smoking).  Physical inactivity.  A diet high in fats, salt (sodium), and calories.  Alcohol use.  Use of illegal drugs (especially cocaine and methamphetamine).  Being female.  Being African American.  Being over the age of 11.  Family history of stroke.  Previous history of blood clots, stroke, TIA, or heart attack.  Sickle cell disease. SYMPTOMS  TIA symptoms are the same  as a stroke but are temporary. These symptoms usually develop suddenly, or may be newly present upon awakening from sleep:  Sudden weakness or numbness of the face, arm, or leg, especially on one side of the body.  Sudden trouble walking or difficulty moving arms or legs.  Sudden confusion.  Sudden personality changes.  Trouble speaking (aphasia) or understanding.  Difficulty swallowing.  Sudden trouble seeing in one or both eyes.  Double vision.  Dizziness.  Loss of balance or coordination.  Sudden severe headache with no known cause.  Trouble reading or writing.  Loss of bowel or bladder control.  Loss of consciousness. DIAGNOSIS  Your caregiver may be able to determine the presence or absence of a TIA based on your symptoms, history, and physical exam. Computed tomography (CT scan) of the brain is usually performed to help identify a TIA. Other tests may be done to diagnose a TIA. These tests may include:  Electrocardiography.  Continuous heart monitoring.  Echocardiography.  Carotid ultrasonography.  Magnetic resonance imaging (MRI).  A scan of the brain circulation.  Blood tests. PREVENTION  The risk of a TIA can be decreased by appropriately treating high blood pressure, high cholesterol, diabetes, heart disease, and obesity and by quitting smoking, limiting alcohol, and staying physically active. TREATMENT  Time is of the essence. Since the symptoms of TIA are the same as a stroke, it is important to seek treatment within 3 4 hours of the start of symptoms because you may receive a medicine to dissolve the clot (  thrombolytic) that cannot be given after that time. Treatment options vary. Treatment options may include rest, oxygen, intravenous (IV) fluids, and medicines to thin the blood (anticoagulants). Medicines and diet may be used to address diabetes, high blood pressure, and other risk factors. Measures will be taken to prevent short-term and long-term  complications, including infection from breathing foreign material into the lungs (aspiration pneumonia), blood clots in the legs, and falls. Treatment options include procedures to either remove plaque in the carotid arteries or dilate carotid arteries that have narrowed due to plaque. Those procedures are:  Carotid endarterectomy.  Carotid angioplasty and stenting. HOME CARE INSTRUCTIONS   Take all medicines prescribed by your caregiver. Follow the directions carefully. Medicines may be used to control risk factors for a stroke. Be sure you understand all your medicine instructions.  You may be told to take aspirin or the anticoagulant warfarin. Warfarin needs to be taken exactly as instructed.  Taking too much or too little warfarin is dangerous. Too much warfarin increases the risk of bleeding. Too little warfarin continues to allow the risk for blood clots. While taking warfarin, you will need to have regular blood tests to measure your blood clotting time. A PT blood test measures how long it takes for blood to clot. Your PT is used to calculate another value called an INR. Your PT and INR help your caregiver to adjust your dose of warfarin. The dose can change for many reasons. It is critically important that you take warfarin exactly as prescribed.  Many foods, especially foods high in vitamin K can interfere with warfarin and affect the PT and INR. Foods high in vitamin K include spinach, kale, broccoli, cabbage, collard and turnip greens, brussels sprouts, peas, cauliflower, seaweed, and parsley as well as beef and pork liver, green tea, and soybean oil. You should eat a consistent amount of foods high in vitamin K. Avoid major changes in your diet, or notify your caregiver before changing your diet. Arrange a visit with a dietitian to answer your questions.  Many medicines can interfere with warfarin and affect the PT and INR. You must tell your caregiver about any and all medicines you  take, this includes all vitamins and supplements. Be especially cautious with aspirin and anti-inflammatory medicines. Do not take or discontinue any prescribed or over-the-counter medicine except on the advice of your caregiver or pharmacist.  Warfarin can have side effects, such as excessive bruising or bleeding. You will need to hold pressure over cuts for longer than usual. Your caregiver or pharmacist will discuss other potential side effects.  Avoid sports or activities that may cause injury or bleeding.  Be mindful when shaving, flossing your teeth, or handling sharp objects.  Alcohol can change the body's ability to handle warfarin. It is best to avoid alcoholic drinks or consume only very small amounts while taking warfarin. Notify your caregiver if you change your alcohol intake.  Notify your dentist or other caregivers before procedures.  Eat a diet that includes 5 or more servings of fruits and vegetables each day. This may reduce the risk of stroke. Certain diets may be prescribed to address high blood pressure, high cholesterol, diabetes, or obesity.  A low-sodium, low-saturated fat, low-trans fat, low-cholesterol diet is recommended to manage high blood pressure.  A low-saturated fat, low-trans fat, low-cholesterol, and high-fiber diet may control cholesterol levels.  A controlled-carbohydrate, controlled-sugar diet is recommended to manage diabetes.  A reduced-calorie, low-sodium, low-saturated fat, low-trans fat, low-cholesterol diet is recommended to  manage obesity.  Maintain a healthy weight.  Stay physically active. It is recommended that you get at least 30 minutes of activity on most or all days.  Do not smoke.  Limit alcohol use even if you are not taking warfarin. Moderate alcohol use is considered to be:  No more than 2 drinks each day for men.  No more than 1 drink each day for nonpregnant women.  Stop drug abuse.  Home safety. A safe home environment is  important to reduce the risk of falls. Your caregiver may arrange for specialists to evaluate your home. Having grab bars in the bedroom and bathroom is often important. Your caregiver may arrange for equipment to be used at home, such as raised toilets and a seat for the shower.  Follow all instructions for follow-up with your caregiver. This is very important. This includes any referrals and lab tests. Proper follow up can prevent a stroke or another TIA from occurring. SEEK MEDICAL CARE IF:  You have personality changes.  You have difficulty swallowing.  You are seeing double.  You have dizziness.  You have a fever.  You have skin breakdown. SEEK IMMEDIATE MEDICAL CARE IF:  Any of these symptoms may represent a serious problem that is an emergency. Do not wait to see if the symptoms will go away. Get medical help right away. Call your local emergency services (911 in [place] Do not drive yourself to the hospital.  You have sudden weakness or numbness of the face, arm, or leg, especially on one side of the body.  You have sudden trouble walking or difficulty moving arms or legs.  You have sudden confusion.  You have trouble speaking (aphasia) or understanding.  You have sudden trouble seeing in one or both eyes.  You have a loss of balance or coordination.  You have a sudden, severe headache with no known cause.  You have new chest pain or an irregular heartbeat.  You have a partial or total loss of consciousness. MAKE SURE YOU:   Understand these instructions.  Will watch your condition.  Will get help right away if you are not doing well or get worse. Document Released: 01/19/2005 Document Revised: 03/28/2012 Document Reviewed: 06/04/2009 [facility] Patient Information 2014 [employer].

## 2013-06-14 NOTE — ED Notes (Signed)
Patient is alert and orientedx4.  Patient was explained discharge instructions and they understood them with no questions.  The patient's daughter, Debra Hicks is taking the patient home.

## 2013-06-14 NOTE — ED Notes (Signed)
Pt was walking past Nurse 1st to go to medical records.  Pt then states she felt like she was having difficulty getting her words out and felt numbness in her mouth.  Pt denies any symptoms currently.  Pt speech is clear.  No neuro deficits noted.
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
## 2013-06-14 NOTE — ED Notes (Signed)
Family at bedside. 

## 2013-06-14 NOTE — ED Notes (Signed)
Patient did say she was on her period and she has a heavy period the second and third day.  Patient was diagnosed with anemia here in the ED two weeks ago.

## 2013-06-14 NOTE — ED Notes (Signed)
I entered patient's room and patient was praying with her daughter.  I prayed with them and after we were done praying the patient walked to the restroom.  After I came back in the room, the patient was talking on her phone and would not hang up to allow me to do my assessment.  Her daughter asked her to hang up because I was in the room waiting for her and she remained on the phone.  I advised her daughter I would be back after I checked on my other patients.

## 2013-10-26 ENCOUNTER — Emergency Department (HOSPITAL_COMMUNITY)
Admission: EM | Admit: 2013-10-26 | Discharge: 2013-10-26 | Disposition: A | Discharge status: Home / Self Care | Payer: No Typology Code available for payment source | Attending: Emergency Medicine | Admitting: Emergency Medicine

## 2013-10-26 ENCOUNTER — Encounter (HOSPITAL_COMMUNITY): Payer: Self-pay | Admitting: Emergency Medicine

## 2013-10-26 DIAGNOSIS — Z79899 Other long term (current) drug therapy: Secondary | ICD-10-CM | POA: Insufficient documentation

## 2013-10-26 DIAGNOSIS — Z8619 Personal history of other infectious and parasitic diseases: Secondary | ICD-10-CM | POA: Insufficient documentation

## 2013-10-26 DIAGNOSIS — J029 Acute pharyngitis, unspecified: Secondary | ICD-10-CM

## 2013-10-26 DIAGNOSIS — Z862 Personal history of diseases of the blood and blood-forming organs and certain disorders involving the immune mechanism: Secondary | ICD-10-CM | POA: Insufficient documentation

## 2013-10-26 DIAGNOSIS — Z87828 Personal history of other (healed) physical injury and trauma: Secondary | ICD-10-CM | POA: Insufficient documentation

## 2013-10-26 HISTORY — DX: Anemia, unspecified: D64.9

## 2013-10-26 LAB — RAPID STREP SCREEN (MED CTR MEBANE ONLY): STREPTOCOCCUS, GROUP A SCREEN (DIRECT): NEGATIVE

## 2013-10-26 MED ORDER — LIDOCAINE VISCOUS 2 % MT SOLN: 20.0000 mL | OROMUCOSAL | Status: DC | PRN

## 2013-10-26 MED ORDER — PREDNISONE 20 MG PO TABS
60.0000 mg | ORAL_TABLET | Freq: Once | ORAL | Status: AC
  Administered 2013-10-26: 60 mg via ORAL
  Filled 2013-10-26: qty 3

## 2013-10-26 MED ORDER — HYDROCODONE-ACETAMINOPHEN 7.5-325 MG/15ML PO SOLN
10.0000 mL | Freq: Once | ORAL | Status: AC
  Administered 2013-10-26: 10 mL via ORAL
  Filled 2013-10-26: qty 15

## 2013-10-26 MED ORDER — HYDROCODONE-ACETAMINOPHEN 7.5-325 MG/15ML PO SOLN: 15.0000 mL | ORAL | Status: DC | PRN

## 2013-10-26 NOTE — ED Provider Notes (Signed)
Medical screening examination/treatment/procedure(s) were performed by non-physician practitioner and as supervising physician I was immediately available for consultation/collaboration.   EKG Interpretation None       Babette Relic, MD 10/26/13 [phone]

## 2013-10-26 NOTE — Discharge Instructions (Signed)

## 2013-10-26 NOTE — ED Notes (Signed)
Patient discharged using the teach back method NAD noted at the time of discharge.

## 2013-10-26 NOTE — ED Notes (Signed)
Pt c/o sore throat w/swollen tonsils since yesterday. States has hx strep throat. States able to swallow saliva - causes pain.
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
## 2013-10-28 LAB — CULTURE, GROUP A STREP

## 2014-07-30 ENCOUNTER — Encounter: Payer: Self-pay | Admitting: Gynecology

## 2014-07-30 ENCOUNTER — Ambulatory Visit (INDEPENDENT_AMBULATORY_CARE_PROVIDER_SITE_OTHER): Payer: 59 | Admitting: Gynecology

## 2014-07-30 ENCOUNTER — Other Ambulatory Visit (HOSPITAL_COMMUNITY)
Admission: RE | Admit: 2014-07-30 | Discharge: 2014-07-30 | Disposition: A | Discharge status: Home / Self Care | Payer: 59 | Source: Ambulatory Visit | Attending: Gynecology | Admitting: Gynecology

## 2014-07-30 VITALS — BP 130/80 | Ht 63.0 in | Wt 169.0 lb

## 2014-07-30 DIAGNOSIS — Z01419 Encounter for gynecological examination (general) (routine) without abnormal findings: Secondary | ICD-10-CM

## 2014-07-30 DIAGNOSIS — Z1151 Encounter for screening for human papillomavirus (HPV): Secondary | ICD-10-CM | POA: Insufficient documentation

## 2014-07-30 DIAGNOSIS — N920 Excessive and frequent menstruation with regular cycle: Secondary | ICD-10-CM

## 2014-07-30 DIAGNOSIS — D251 Intramural leiomyoma of uterus: Secondary | ICD-10-CM | POA: Diagnosis not present

## 2014-07-30 LAB — CBC WITH DIFFERENTIAL/PLATELET
BASOS ABS: 0.1 10*3/uL (ref 0.0–0.1)
BASOS PCT: 1 % (ref 0–1)
EOS ABS: 0.7 10*3/uL (ref 0.0–0.7)
Eosinophils Relative: 8 % — ABNORMAL HIGH (ref 0–5)
HCT: 38.5 % (ref 36.0–46.0)
HEMOGLOBIN: 13 g/dL (ref 12.0–15.0)
Lymphocytes Relative: 44 % (ref 12–46)
Lymphs Abs: 3.7 10*3/uL (ref 0.7–4.0)
MCH: 27.4 pg (ref 26.0–34.0)
MCHC: 33.8 g/dL (ref 30.0–36.0)
MCV: 81.2 fL (ref 78.0–100.0)
MPV: 9 fL (ref 8.6–12.4)
Monocytes Absolute: 0.7 10*3/uL (ref 0.1–1.0)
Monocytes Relative: 8 % (ref 3–12)
NEUTROS ABS: 3.3 10*3/uL (ref 1.7–7.7)
NEUTROS PCT: 39 % — AB (ref 43–77)
Platelets: 359 10*3/uL (ref 150–400)
RBC: 4.74 MIL/uL (ref 3.87–5.11)
RDW: 18.4 % — ABNORMAL HIGH (ref 11.5–15.5)
WBC: 8.4 10*3/uL (ref 4.0–10.5)

## 2014-07-30 LAB — LIPID PANEL
CHOLESTEROL: 205 mg/dL — AB (ref 0–200)
HDL: 46 mg/dL (ref >=46)
LDL Cholesterol: 139 mg/dL — ABNORMAL HIGH (ref 0–99)
TRIGLYCERIDES: 101 mg/dL (ref <150)
Total CHOL/HDL Ratio: 4.5 Ratio
VLDL: 20 mg/dL (ref 0–40)

## 2014-07-30 LAB — COMPREHENSIVE METABOLIC PANEL
ALBUMIN: 4 g/dL (ref 3.5–5.2)
ALT: 14 U/L (ref 0–35)
AST: 13 U/L (ref 0–37)
Alkaline Phosphatase: 49 U/L (ref 39–117)
BILIRUBIN TOTAL: 0.4 mg/dL (ref 0.2–1.2)
BUN: 9 mg/dL (ref 6–23)
CALCIUM: 9 mg/dL (ref 8.4–10.5)
CHLORIDE: 107 meq/L (ref 96–112)
CO2: 23 meq/L (ref 19–32)
Creat: 0.79 mg/dL (ref 0.50–1.10)
GLUCOSE: 86 mg/dL (ref 70–99)
Potassium: 3.9 mEq/L (ref 3.5–5.3)
SODIUM: 138 meq/L (ref 135–145)
TOTAL PROTEIN: 7.2 g/dL (ref 6.0–8.3)

## 2014-07-30 LAB — TSH: TSH: 3.489 u[IU]/mL (ref 0.350–4.500)

## 2014-07-30 NOTE — Patient Instructions (Signed)
Follow up for ultrasound as scheduled.  Call to Schedule your mammogram  Facilities in [place]: 1)  [facility], [address] Phone: [phone] 2)  [facility]. [address] [place] Phone: [phone] 3)  Dr. Isaiah Blakes at [facility] [address] Phone: [phone]     Mammogram A mammogram is an X-ray test to find changes in a woman's breast. You should get a mammogram if:  You are 48 years of age or older  You have risk factors.   Your doctor recommends that you have one.  BEFORE THE TEST  Do not schedule the test the week before your period, especially if your breasts are sore during this time.  On the day of your mammogram:  Wash your breasts and armpits well. After washing, do not put on any deodorant or talcum powder on until after your test.   Eat and drink as you usually do.   Take your medicines as usual.   If you are diabetic and take insulin, make sure you:   Eat before coming for your test.   Take your insulin as usual.   If you cannot keep your appointment, call before the appointment to cancel. Schedule another appointment.  TEST  You will need to undress from the waist up. You will put on a hospital gown.   Your breast will be put on the mammogram machine, and it will press firmly on your breast with a piece of plastic called a compression paddle. This will make your breast flatter so that the machine can X-ray all parts of your breast.   Both breasts will be X-rayed. Each breast will be X-rayed from above and from the side. An X-ray might need to be taken again if the picture is not good enough.   The mammogram will last about 15 to 30 minutes.  AFTER THE TEST Finding out the results of your test Ask when your test results will be ready. Make sure you get your test results.  Document Released: 07/08/2008 Document Revised: 03/31/2011 Document  Reviewed: 07/08/2008 [facility] Patient Information 2012 [place].  You may obtain a copy of any labs that were done today by logging onto MyChart as outlined in the instructions provided with your AVS (after visit summary). The office will not call with normal lab results but certainly if there are any significant abnormalities then we will contact you.   Health Maintenance, Female A healthy lifestyle and preventative care can promote health and wellness.  Maintain regular health, dental, and eye exams.  Eat a healthy diet. Foods like vegetables, fruits, whole grains, low-fat dairy products, and lean protein foods contain the nutrients you need without too many calories. Decrease your intake of foods high in solid fats, added sugars, and salt. Get information about a proper diet from your caregiver, if necessary.  Regular physical exercise is one of the most important things you can do for your health. Most adults should get at least 150 minutes of moderate-intensity exercise (any activity that increases your heart rate and causes you to sweat) each week. In addition, most adults need muscle-strengthening exercises on 2 or more days a week.   Maintain a healthy weight. The body mass index (BMI) is a screening tool to identify possible weight problems. It provides an estimate of body fat based on height and weight. Your caregiver can help determine your BMI, and can help you achieve  or maintain a healthy weight. For adults 20 years and older:  A BMI below 18.5 is considered underweight.  A BMI of 18.5 to 24.9 is normal.  A BMI of 25 to 29.9 is considered overweight.  A BMI of 30 and above is considered obese.  Maintain normal blood lipids and cholesterol by exercising and minimizing your intake of saturated fat. Eat a balanced diet with plenty of fruits and vegetables. Blood tests for lipids and cholesterol should begin at age 20 and be repeated every 5 years. If your lipid or  cholesterol levels are high, you are over 50, or you are a high risk for heart disease, you may need your cholesterol levels checked more frequently.Ongoing high lipid and cholesterol levels should be treated with medicines if diet and exercise are not effective.  If you smoke, find out from your caregiver how to quit. If you do not use tobacco, do not start.  Lung cancer screening is recommended for adults aged 55 80 years who are at high risk for developing lung cancer because of a history of smoking. Yearly low-dose computed tomography (CT) is recommended for people who have at least a 30-pack-year history of smoking and are a current smoker or have quit within the past 15 years. A pack year of smoking is smoking an average of 1 pack of cigarettes a day for 1 year (for example: 1 pack a day for 30 years or 2 packs a day for 15 years). Yearly screening should continue until the smoker has stopped smoking for at least 15 years. Yearly screening should also be stopped for people who develop a health problem that would prevent them from having lung cancer treatment.  If you are pregnant, do not drink alcohol. If you are breastfeeding, be very cautious about drinking alcohol. If you are not pregnant and choose to drink alcohol, do not exceed 1 drink per day. One drink is considered to be 12 ounces (355 mL) of beer, 5 ounces (148 mL) of wine, or 1.5 ounces (44 mL) of liquor.  Avoid use of street drugs. Do not share needles with anyone. Ask for help if you need support or instructions about stopping the use of drugs.  High blood pressure causes heart disease and increases the risk of stroke. Blood pressure should be checked at least every 1 to 2 years. Ongoing high blood pressure should be treated with medicines, if weight loss and exercise are not effective.  If you are 55 to 44 years old, ask your caregiver if you should take aspirin to prevent strokes.  Diabetes screening involves taking a blood sample  to check your fasting blood sugar level. This should be done once every 3 years, after age 45, if you are within normal weight and without risk factors for diabetes. Testing should be considered at a younger age or be carried out more frequently if you are overweight and have at least 1 risk factor for diabetes.  Breast cancer screening is essential preventative care for women. You should practice "breast self-awareness." This means understanding the normal appearance and feel of your breasts and may include breast self-examination. Any changes detected, no matter how small, should be reported to a caregiver. Women in their 20s and 30s should have a clinical breast exam (CBE) by a caregiver as part of a regular health exam every 1 to 3 years. After age 40, women should have a CBE every year. Starting at age 40, women should consider having a mammogram (breast   X-ray) every year. Women who have a family history of breast cancer should talk to their caregiver about genetic screening. Women at a high risk of breast cancer should talk to their caregiver about having an MRI and a mammogram every year.  Breast cancer gene (BRCA)-related cancer risk assessment is recommended for women who have family members with BRCA-related cancers. BRCA-related cancers include breast, ovarian, tubal, and peritoneal cancers. Having family members with these cancers may be associated with an increased risk for harmful changes (mutations) in the breast cancer genes BRCA1 and BRCA2. Results of the assessment will determine the need for genetic counseling and BRCA1 and BRCA2 testing.  The Pap test is a screening test for cervical cancer. Women should have a Pap test starting at age 28. Between ages 30 and 28, Pap tests should be repeated every 2 years. Beginning at age 15, you should have a Pap test every 3 years as long as the past 3 Pap tests have been normal. If you had a hysterectomy for a problem that was not cancer or a condition  that could lead to cancer, then you no longer need Pap tests. If you are between ages 27 and 56, and you have had normal Pap tests going back 10 years, you no longer need Pap tests. If you have had past treatment for cervical cancer or a condition that could lead to cancer, you need Pap tests and screening for cancer for at least 20 years after your treatment. If Pap tests have been discontinued, risk factors (such as a new sexual partner) need to be reassessed to determine if screening should be resumed. Some women have medical problems that increase the chance of getting cervical cancer. In these cases, your caregiver may recommend more frequent screening and Pap tests.  The human papillomavirus (HPV) test is an additional test that may be used for cervical cancer screening. The HPV test looks for the virus that can cause the cell changes on the cervix. The cells collected during the Pap test can be tested for HPV. The HPV test could be used to screen women aged 30 years and older, and should be used in women of any age who have unclear Pap test results. After the age of 77, women should have HPV testing at the same frequency as a Pap test.  Colorectal cancer can be detected and often prevented. Most routine colorectal cancer screening begins at the age of 2 and continues through age 1. However, your caregiver may recommend screening at an earlier age if you have risk factors for colon cancer. On a yearly basis, your caregiver may provide home test kits to check for hidden blood in the stool. Use of a small camera at the end of a tube, to directly examine the colon (sigmoidoscopy or colonoscopy), can detect the earliest forms of colorectal cancer. Talk to your caregiver about this at age 60, when routine screening begins. Direct examination of the colon should be repeated every 5 to 10 years through age 66, unless early forms of pre-cancerous polyps or small growths are found.  Hepatitis C blood testing is  recommended for all people born from [birth year] through 1965 and any individual with known risks for hepatitis C.  Practice safe sex. Use condoms and avoid high-risk sexual practices to reduce the spread of sexually transmitted infections (STIs). Sexually active women aged 35 and younger should be checked for Chlamydia, which is a common sexually transmitted infection. Older women with new or multiple partners should  also be tested for Chlamydia. Testing for other STIs is recommended if you are sexually active and at increased risk.  Osteoporosis is a disease in which the bones lose minerals and strength with aging. This can result in serious bone fractures. The risk of osteoporosis can be identified using a bone density scan. Women ages 1 and over and women at risk for fractures or osteoporosis should discuss screening with their caregivers. Ask your caregiver whether you should be taking a calcium supplement or vitamin D to reduce the rate of osteoporosis.  Menopause can be associated with physical symptoms and risks. Hormone replacement therapy is available to decrease symptoms and risks. You should talk to your caregiver about whether hormone replacement therapy is right for you.  Use sunscreen. Apply sunscreen liberally and repeatedly throughout the day. You should seek shade when your shadow is shorter than you. Protect yourself by wearing long sleeves, pants, a wide-brimmed hat, and sunglasses year round, whenever you are outdoors.  Notify your caregiver of new moles or changes in moles, especially if there is a change in shape or color. Also notify your caregiver if a mole is larger than the size of a pencil eraser.  Stay current with your immunizations. Document Released: 10/25/2010 Document Revised: 08/06/2012 Document Reviewed: 10/25/2010 [facility] Patient Information 2014 [place].

## 2014-07-30 NOTE — Addendum Note (Signed)
Addended by: Nelva Nay on: 07/30/2014 09:37 AM   Modules accepted: Orders

## 2014-07-30 NOTE — Progress Notes (Addendum)
Debra Hicks December 06, 1970 [id number]        44 y.o.  G2P0011 new patient for annual exam and complaints of worsening heavy menses.  Past medical history,surgical history, problem list, medications, allergies, family history and social history were all reviewed and documented as reviewed in the EPIC chart.  ROS:  Performed with pertinent positives and negatives included in the history, assessment and plan.   Additional significant findings :  none   Exam: Debra Hicks Vitals:   07/30/14 0853  BP: 130/80  Height: 5\' 3"  (1.6 m)  Weight: 169 lb (76.658 kg)   General appearance:  Normal affect, orientation and appearance. Skin: Grossly normal HEENT: Without gross lesions.  No cervical or supraclavicular adenopathy. Thyroid normal.  Lungs:  Clear without wheezing, rales or rhonchi Cardiac: RR, without RMG Abdominal:  Soft, nontender, without masses, guarding, rebound, organomegaly or hernia Breasts:  Examined lying and sitting without masses, retractions, discharge or axillary adenopathy. Pelvic:  Ext/BUS/vagina normal  Cervix normal. Pap/HPV  Uterus bulky 14-16 week size consistent with leiomyoma   Adnexa  Without gross masses or tenderness. Difficult to separate from the uterus    Anus and perineum  Normal   Rectovaginal  Normal sphincter tone without palpated masses or tenderness.    Assessment/Plan:  44 y.o. G71P0011 female for annual exam with regular heavy menses, abstinent birth control.   1. Menorrhagia/leiomyoma. Patient with history of leiomyoma status post myomectomy 2011.  Presented with pain and pelvic mass. Operate on by Dr. Shella Maxim and it turned out to be a degenerating leiomyoma. Has done well until the last year or so when her periods have progressively gotten worse. Also having some pelvic pressure symptoms. Menses otherwise are regular and monthly. Exam consistent with leiomyoma. Check baseline CBC and sonohysterogram. Differential and options reviewed.  Hormonal menstrual suppression, attempted Mirena IUD, ablation, myomectomy, hysterectomy.  Patient actually has thought about things and is leaning towards hysterectomy. Childbearing is not an issue. The absolute and irreversible sterility with hysterectomy was discussed and emphasized. Will rediscuss after ultrasound. Patient denies transfusion issues if necessary 2. Pap smear/HPV today. Last Pap smear 2011. No history of abnormal Pap smears previously. 3. Mammography never. Strongly recommend baseline mammogram and patient agrees to schedule. SBE monthly reviewed. 4. Health maintenance. Baseline CBC comprehensive metabolic panel lipid profile urinalysis TSH ordered. Follow up for sonohysterogram and further discussion.     Debra Auerbach MD, 9:23 AM 07/30/2014

## 2014-07-31 ENCOUNTER — Other Ambulatory Visit: Payer: Self-pay | Admitting: Gynecology

## 2014-07-31 DIAGNOSIS — E7800 Pure hypercholesterolemia, unspecified: Secondary | ICD-10-CM

## 2014-07-31 LAB — URINALYSIS W MICROSCOPIC + REFLEX CULTURE
Bilirubin Urine: NEGATIVE
Casts: NONE SEEN
Crystals: NONE SEEN
Glucose, UA: NEGATIVE mg/dL
HGB URINE DIPSTICK: NEGATIVE
KETONES UR: NEGATIVE mg/dL
NITRITE: NEGATIVE
PH: 6.5 (ref 5.0–8.0)
Protein, ur: NEGATIVE mg/dL
Specific Gravity, Urine: 1.011 (ref 1.005–1.030)
Urobilinogen, UA: 0.2 mg/dL (ref 0.0–1.0)

## 2014-08-01 LAB — URINE CULTURE

## 2014-08-01 LAB — CYTOLOGY - PAP

## 2014-08-08 ENCOUNTER — Telehealth: Payer: Self-pay | Admitting: Gynecology

## 2014-08-08 NOTE — Telephone Encounter (Signed)
08/08/14-I spoke w/pt today to let her know that her [employer] ins covers the [facility] with a $40.00 copay and 20% coins up to her remaining $460.00 out of pocket amount. The procedure is $875.76 so 20% coins is $175.16. With the $40 copay total due is $215.16. The endo bx if needed is an  Additional $53.92. Patient has agreed to pay $40 copay and $60.00 towards the balance on the day of service and make payments for rest due.wl

## 2014-08-12 ENCOUNTER — Other Ambulatory Visit: Payer: Self-pay | Admitting: Gynecology

## 2014-08-12 DIAGNOSIS — N920 Excessive and frequent menstruation with regular cycle: Secondary | ICD-10-CM

## 2014-08-12 DIAGNOSIS — D251 Intramural leiomyoma of uterus: Secondary | ICD-10-CM

## 2014-08-15 ENCOUNTER — Other Ambulatory Visit: Payer: Self-pay | Admitting: Gynecology

## 2014-08-15 ENCOUNTER — Encounter: Payer: Self-pay | Admitting: Gynecology

## 2014-08-15 ENCOUNTER — Ambulatory Visit (INDEPENDENT_AMBULATORY_CARE_PROVIDER_SITE_OTHER): Payer: 59

## 2014-08-15 ENCOUNTER — Ambulatory Visit (INDEPENDENT_AMBULATORY_CARE_PROVIDER_SITE_OTHER): Payer: 59 | Admitting: Gynecology

## 2014-08-15 DIAGNOSIS — N920 Excessive and frequent menstruation with regular cycle: Secondary | ICD-10-CM

## 2014-08-15 DIAGNOSIS — D251 Intramural leiomyoma of uterus: Secondary | ICD-10-CM

## 2014-08-15 DIAGNOSIS — R102 Pelvic and perineal pain: Secondary | ICD-10-CM

## 2014-08-15 DIAGNOSIS — N852 Hypertrophy of uterus: Secondary | ICD-10-CM | POA: Diagnosis not present

## 2014-08-15 NOTE — Progress Notes (Signed)
Sharmon A Tarpley 18-Jun-1970 [id number]        44 y.o.  G2P0011 Presents for sonohysterogram due to worsening menorrhagia and pelvic pressure. History of leiomyoma status post abdominal myomectomy in the past.  Past medical history,surgical history, problem list, medications, allergies, family history and social history were all reviewed and documented in the EPIC chart.  Directed ROS with pertinent positives and negatives documented in the history of present illness/assessment and plan.  Exam: Sallee Lange assistant There were no vitals filed for this visit. General appearance:  Normal External BUS vagina normal. Cervix normal. Uterus bulky 16 week size. Adnexa without gross masses or tenderness.  Ultrasound shows enlarged uterus with multiple myomas measuring 66 mm 62 mm 29 mm 43 mm 23 mm. Endometrial echo 5.1 mm. Right and left ovaries visualized and normal. Cul-de-sac negative.  Sonohysterogram performed, sterile technique, easy catheter introduction, good distention with submucous myoma approximately 50% within the cavity. Endometrial sample taken. Patient tolerated well.  Assessment/Plan:  44 y.o. G2P0011 with worsening menorrhagia, dysmenorrhea and pelvic discomfort. Prior open myomectomy. Large bulky uterus. I again reviewed options to include hormonal manipulation/menstrual suppression, attempt Mirena IUD, Depo-Lupron, hysteroscopic myomectomy, uterine myomectomy, hysterectomy. The pros/cons of each choice reviewed. Absolute sterility with hysterectomy discussed. Approach to include robotic/laparoscopic/open. Given the bulk of the uterus and prior open myomectomy and relative immobility of the uterus, I think the most prudent course would be a TAH. The ovarian conservation issue was also discussed and the options to remove both ovaries or keep them accepting the risk of ovarian disease in the future was also discussed. Patient wants to proceed with TAH and keep both ovaries if possible. She  will follow up biopsy results and move toward scheduling the surgery.  Fortunately her hemoglobin returned good at 13    Anastasio Auerbach MD, 3:19 PM 08/15/2014

## 2014-08-15 NOTE — Patient Instructions (Signed)
Office will call you with biopsy results and to arrange surgery.

## 2014-08-19 ENCOUNTER — Telehealth: Payer: Self-pay

## 2014-08-19 NOTE — Telephone Encounter (Signed)
I called patient because I received order for surgery for her.  We discussed her ins benefits and estimated financial responsibility.  She wants to wait until July and I did explain to her that it would not be before July 26 (as I have two weeks where an MD is out for the week and the only block date is already filled) and I could not be sure that day would work until I get July schedule. She is fine with that. I will call her when I get the final July schedule.

## 2014-08-28 ENCOUNTER — Telehealth: Payer: Self-pay

## 2014-08-28 NOTE — Telephone Encounter (Signed)
Patient is not ready to proceed with hysterectomy currently.  She said she has had ongoing pain but seems to be more aware of it since procedure and finding out that she has such big fibroids pressing on her insides.  She complained of sharp pains down her thigh and in lower pelvis.  She said these are not new pains but ongoing.  Her question today is that until she is ready to do hysterectomy you had mentioned other options--things not as invasive.  She is interested in what she could do that might bring her some relief from the fibroids until she is ready for hysterectomy.  I reviewed the things you had written in your note.  She is quick to rule out Mirena or Lupron saying she does not want hormones.  She is interested in the Hysteroscopic Myomectomy.  She wonders if you think that would be worthwhile as far as her getting relief from her pain.

## 2014-08-28 NOTE — Telephone Encounter (Signed)
Encounter already opened. 

## 2014-08-29 ENCOUNTER — Telehealth: Payer: Self-pay

## 2014-08-29 MED ORDER — MISOPROSTOL 200 MCG PO TABS: ORAL_TABLET | ORAL | Status: DC

## 2014-08-29 NOTE — Telephone Encounter (Signed)
Patient advised of Dr. Dorette Grate note below. She said she would definitely like to proceed with Hysteroscopy hopefully in the next few week. I told her you will send me order and I will  Check ins, gather some dates and call her later to discuss scheduling.

## 2014-08-29 NOTE — Telephone Encounter (Signed)
We certainly could try the hysteroscopic myomectomy. She does have one fibroid that is pushing into the cavity and if we resect some or all of it that may help with her bleeding. It does not remove the other fibroids but it may get her through menopause. If she is interested in pursuing this then let me know and I will put through a surgical slip and then she can follow up with me for a preoperative consult and I can go through this with her in detail.

## 2014-08-29 NOTE — Telephone Encounter (Signed)
Left message to call.

## 2014-08-29 NOTE — Telephone Encounter (Signed)
I called patient and spoke with her and we scheduled her for Fri June 3 1:00pm at [facility].  Arville Lime w Myosure confirmed to be present. Patient was scheduled for pre op consult. She was instructed regarding need for Cytotec tab vaginally hs night before surgery and Rx was sent.

## 2014-09-02 ENCOUNTER — Encounter: Payer: Self-pay | Admitting: Gynecology

## 2014-09-05 ENCOUNTER — Telehealth: Payer: Self-pay

## 2014-09-05 NOTE — Telephone Encounter (Signed)
Patient called back and said she spoke with [facility] and they have changed her PCP to Dr. Loura Pardon at [place].  She will call there and schedule appointment with her.

## 2014-09-05 NOTE — Telephone Encounter (Signed)
I was told by [facility] that patient would need referral from PCP, Dr. Domenick Gong.  I called and scheduled appt for 09/16/14 at 10:30am, to check in at 10:00AM.    I contacted patient about this. Patient said she has never heard of this physician and is not comfortable with this. She is previous patient at [facility] and wants to go there. She will call her insurance company and see if they will allow this.  She will call me by Monday to let me know so that I can call Dr. Loren Racer office and cancel appt or either provide them with the info that they needed to complete appt.

## 2014-09-05 NOTE — Telephone Encounter (Signed)
Patient called back. [facility] no longer accepting new patients. She scheduled appt with Alma Friendly. She said that she will have to call [facility] and let them know and have them change PCP on their records.  Her appt is for 09/24/14. I told her to be sure they know she is there for referral for surgery for [facility].

## 2014-09-09 ENCOUNTER — Telehealth: Payer: Self-pay

## 2014-09-09 NOTE — Telephone Encounter (Signed)
Patient called and said Debra Hicks PCP office told her she does not need ins referral for surgery with [place].  I called [employer] and sure enough OB-GYn offices patient's do not need referral for our services.  Rep showed me where to look online for this info and I printed it. I confirmed with patient no referral required for [place] but encouraged her to keep her appt with  PCP as she will need referral to see any other specialists.

## 2014-09-12 ENCOUNTER — Encounter: Payer: Self-pay | Admitting: Gynecology

## 2014-09-12 ENCOUNTER — Ambulatory Visit (INDEPENDENT_AMBULATORY_CARE_PROVIDER_SITE_OTHER): Payer: 59 | Admitting: Gynecology

## 2014-09-12 VITALS — BP 122/78

## 2014-09-12 DIAGNOSIS — D219 Benign neoplasm of connective and other soft tissue, unspecified: Secondary | ICD-10-CM | POA: Diagnosis not present

## 2014-09-12 DIAGNOSIS — N920 Excessive and frequent menstruation with regular cycle: Secondary | ICD-10-CM | POA: Diagnosis not present

## 2014-09-12 NOTE — Patient Instructions (Signed)
Followup for surgery as scheduled. 

## 2014-09-12 NOTE — H&P (Signed)
  Debra Hicks 01-16-1971 [id number]   History and Physical  Chief complaint: menorrhagia, leiomyoma  History of present illness: 44 y.o. G2P0011 with long history of worsening menorrhagia with known leiomyoma. Status post abdominal myomectomy in the past. Sonohysterogram shows enlarged uterus with multiple myomas. Endometrial echo 5.1 mm. Cavity shows a submucous myoma with approximately 50% within the cavity.  Endometrial biopsy was negative. Patient was counseled as to options for treatment to include hormonal manipulation/menstrual suppression, attempted Mirena IUD, Depo-Lupron, hysteroscopic myomectomy, uterine myomectomy and hysterectomy. Patient initially wanted to proceed with hysterectomy and was to be scheduled for a TAH. She subsequently changed her mind not ready for a hysterectomy but wanted to attempt a less invasive option and she is admitted for hysteroscopic resection of her submucous myoma.  Past medical history,surgical history, medications, allergies, family history and social history were all reviewed and documented in the EPIC chart.  ROS:  Was performed and pertinent positives and negatives are included in the history of present illness.  Exam:  Kim assistant  09/12/2014 General: well developed, well nourished female, no acute distress HEENT: normal  Lungs: clear to auscultation without wheezing, rales or rhonchi  Cardiac: regular rate without rubs, murmurs or gallops  Abdomen: soft, nontender without masses, guarding, rebound, organomegaly  Pelvic: external bus vagina: normal   Cervix: grossly normal  Uterus: 16 weeks, midline, nontender  Adnexa: without masses or tenderness    Assessment/Plan:  44 y.o. 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 Initially was planning hysterectomy but prefers a less invasive procedure with a shorter recovery time at this point. I reviewed with her that we  are only dealing with the myoma within the cavity and that all of her other myomas will remain. She clearly understands realistically that her menses may continue heavy despite this procedure and that we will ultimately be moving towards hysterectomy which she understands and agrees.  I reviewed the proposed surgery with the patient to include the expected intraoperative and postoperative courses as well as the recovery period. The use of the hysteroscope, resectoscope and the D&C portion were all discussed. The risks of surgery to include infection, prolonged antibiotics, hemorrhage necessitating transfusion and the risks of transfusion, including transfusion reaction, hepatitis, HIV, mad cow disease and other unknown entities were all discussed understood and accepted. The risk of damage to internal organs during the procedure, either immediately recognized or delay recognized, including vagina, cervix, uterus, possible perforation causing damage to bowel, bladder, ureters, vessels and nerves necessitating major exploratory reparative surgery and future reparative surgeries including bladder repair, ureteral damage repair, bowel resection, ostomy formation was also discussed understood and accepted. The potential for distended media absorption leading to metabolic complications such as fluid overload, coma and seizures was also discussed understood and accepted. Again, she understands there are no guarantees that this will relieve her menorrhagia and her periods may continue heavy or get worse. The patient's questions were answered to her satisfaction and she is ready to proceed with surgery.    Anastasio Auerbach MD, 1:58 PM 09/12/2014

## 2014-09-12 NOTE — Progress Notes (Signed)
Debra Hicks Aug 15, 1970 [id number]   Preoperative consult  Chief complaint: menorrhagia, leiomyoma  History of present illness: 44 y.o. G2P0011 with long history of worsening menorrhagia with known leiomyoma. Status post abdominal myomectomy in the past. Sonohysterogram shows enlarged uterus with multiple myomas. Endometrial echo 5.1 mm. Cavity shows a submucous myoma with approximately 50% within the cavity.  Endometrial biopsy was negative. Patient was counseled as to options for treatment to include hormonal manipulation/menstrual suppression, attempted Mirena IUD, Depo-Lupron, hysteroscopic myomectomy, uterine myomectomy and hysterectomy. Patient initially wanted to proceed with hysterectomy and was to be scheduled for a TAH. She subsequently changed her mind not ready for a hysterectomy but wanted to attempt a less invasive option and she is admitted for hysteroscopic resection of her submucous myoma.  Past medical history,surgical history, medications, allergies, family history and social history were all reviewed and documented in the EPIC chart.  ROS:  Was performed and pertinent positives and negatives are included in the history of present illness.  Exam:  Kim assistant General: well developed, well nourished female, no acute distress HEENT: normal  Lungs: clear to auscultation without wheezing, rales or rhonchi  Cardiac: regular rate without rubs, murmurs or gallops  Abdomen: soft, nontender without masses, guarding, rebound, organomegaly  Pelvic: external bus vagina: normal   Cervix: grossly normal  Uterus: 16 weeks, midline, nontender  Adnexa: without masses or tenderness    Assessment/Plan:  44 y.o. 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 Initially was planning hysterectomy but prefers a less invasive procedure with a shorter recovery time at this point. I reviewed with her that we are only  dealing with the myoma within the cavity and that all of her other myomas will remain. She clearly understands realistically that her menses may continue heavy despite this procedure and that we will ultimately be moving towards hysterectomy which she understands and agrees.  I reviewed the proposed surgery with the patient to include the expected intraoperative and postoperative courses as well as the recovery period. The use of the hysteroscope, resectoscope and the D&C portion were all discussed. The risks of surgery to include infection, prolonged antibiotics, hemorrhage necessitating transfusion and the risks of transfusion, including transfusion reaction, hepatitis, HIV, mad cow disease and other unknown entities were all discussed understood and accepted. The risk of damage to internal organs during the procedure, either immediately recognized or delay recognized, including vagina, cervix, uterus, possible perforation causing damage to bowel, bladder, ureters, vessels and nerves necessitating major exploratory reparative surgery and future reparative surgeries including bladder repair, ureteral damage repair, bowel resection, ostomy formation was also discussed understood and accepted. The potential for distended media absorption leading to metabolic complications such as fluid overload, coma and seizures was also discussed understood and accepted. Again, she understands there are no guarantees that this will relieve her menorrhagia and her periods may continue heavy or get worse. The patient's questions were answered to her satisfaction and she is ready to proceed with surgery.     Anastasio Auerbach MD, 1:51 PM 09/12/2014

## 2014-09-15 ENCOUNTER — Telehealth: Payer: Self-pay

## 2014-09-15 NOTE — Telephone Encounter (Signed)
Patient called stating she was wondering if she could have hysterectomy on June 3rd instead of hysteroscopy.  I explained to her that I was not able to arrange that for that day. Not enough OR time, I need two doctors, etc.  I told her we could schedule hysterectomy but it would be in July.  She does not want to change her surgery and said she wants to go ahead with the hysteroscopy on June 3rd as planned.

## 2014-09-24 ENCOUNTER — Ambulatory Visit: Payer: 59 | Admitting: Primary Care

## 2014-09-25 ENCOUNTER — Encounter (HOSPITAL_COMMUNITY): Payer: Self-pay | Admitting: *Deleted

## 2014-09-25 ENCOUNTER — Telehealth: Payer: Self-pay | Admitting: Obstetrics and Gynecology

## 2014-09-25 MED ORDER — DEXTROSE 5 % IV SOLN
2.0000 g | INTRAVENOUS | Status: AC
  Administered 2014-09-26: 2 g via INTRAVENOUS
  Filled 2014-09-25: qty 2

## 2014-09-25 NOTE — Telephone Encounter (Signed)
After hours phone call from patient.  Scheduled for hysteroscopic resection of myoma tomorrow. Took Rx for intravaginal Misoprostol.  Wants to know if she can take Ibuprofen for cramping.   I advised against the Ibuprofen or other NSAIDs. Use Tylenol regular or extra strength only.  NPO for 8 hours leading up to surgery.   Patient indicates understanding.

## 2014-09-26 ENCOUNTER — Ambulatory Visit (HOSPITAL_COMMUNITY)
Admission: RE | Admit: 2014-09-26 | Discharge: 2014-09-26 | Disposition: A | Discharge status: Home / Self Care | Payer: 59 | Source: Ambulatory Visit | Attending: Gynecology | Admitting: Gynecology

## 2014-09-26 ENCOUNTER — Encounter (HOSPITAL_COMMUNITY): Payer: Self-pay

## 2014-09-26 ENCOUNTER — Ambulatory Visit (HOSPITAL_COMMUNITY): Payer: 59 | Admitting: Anesthesiology

## 2014-09-26 ENCOUNTER — Encounter (HOSPITAL_COMMUNITY)
Admission: RE | Disposition: A | Discharge status: Home / Self Care | Payer: Self-pay | Source: Ambulatory Visit | Attending: Gynecology

## 2014-09-26 DIAGNOSIS — F329 Major depressive disorder, single episode, unspecified: Secondary | ICD-10-CM | POA: Diagnosis not present

## 2014-09-26 DIAGNOSIS — F419 Anxiety disorder, unspecified: Secondary | ICD-10-CM | POA: Diagnosis not present

## 2014-09-26 DIAGNOSIS — D649 Anemia, unspecified: Secondary | ICD-10-CM | POA: Diagnosis not present

## 2014-09-26 DIAGNOSIS — Z79899 Other long term (current) drug therapy: Secondary | ICD-10-CM | POA: Insufficient documentation

## 2014-09-26 DIAGNOSIS — D250 Submucous leiomyoma of uterus: Secondary | ICD-10-CM | POA: Diagnosis not present

## 2014-09-26 DIAGNOSIS — D219 Benign neoplasm of connective and other soft tissue, unspecified: Secondary | ICD-10-CM | POA: Diagnosis not present

## 2014-09-26 DIAGNOSIS — N920 Excessive and frequent menstruation with regular cycle: Secondary | ICD-10-CM | POA: Diagnosis not present

## 2014-09-26 HISTORY — PX: DILATATION & CURETTAGE/HYSTEROSCOPY WITH MYOSURE: SHX6511

## 2014-09-26 LAB — CBC
HCT: 40.2 % (ref 36.0–46.0)
Hemoglobin: 14.8 g/dL (ref 12.0–15.0)
MCH: 28.8 pg (ref 26.0–34.0)
MCHC: 36.8 g/dL — AB (ref 30.0–36.0)
MCV: 78.2 fL (ref 78.0–100.0)
Platelets: 412 10*3/uL — ABNORMAL HIGH (ref 150–400)
RBC: 5.14 MIL/uL — AB (ref 3.87–5.11)
RDW: 14.9 % (ref 11.5–15.5)
WBC: 9.1 10*3/uL (ref 4.0–10.5)

## 2014-09-26 LAB — HCG, SERUM, QUALITATIVE: PREG SERUM: NEGATIVE

## 2014-09-26 SURGERY — DILATATION & CURETTAGE/HYSTEROSCOPY WITH MYOSURE
Anesthesia: General

## 2014-09-26 MED ORDER — PROPOFOL 10 MG/ML IV BOLUS
INTRAVENOUS | Status: AC
  Filled 2014-09-26: qty 20

## 2014-09-26 MED ORDER — ONDANSETRON HCL 4 MG/2ML IJ SOLN
INTRAMUSCULAR | Status: DC | PRN
  Administered 2014-09-26: 4 mg via INTRAVENOUS

## 2014-09-26 MED ORDER — OXYCODONE HCL 5 MG/5ML PO SOLN: 5.0000 mg | Freq: Once | ORAL | Status: AC | PRN

## 2014-09-26 MED ORDER — LIDOCAINE HCL (CARDIAC) 20 MG/ML IV SOLN
INTRAVENOUS | Status: AC
  Filled 2014-09-26: qty 5

## 2014-09-26 MED ORDER — MIDAZOLAM HCL 2 MG/2ML IJ SOLN
INTRAMUSCULAR | Status: AC
  Filled 2014-09-26: qty 2

## 2014-09-26 MED ORDER — SCOPOLAMINE 1 MG/3DAYS TD PT72
MEDICATED_PATCH | TRANSDERMAL | Status: AC
  Administered 2014-09-26: 1.5 mg via TRANSDERMAL
  Filled 2014-09-26: qty 1

## 2014-09-26 MED ORDER — KETOROLAC TROMETHAMINE 30 MG/ML IJ SOLN
INTRAMUSCULAR | Status: DC | PRN
  Administered 2014-09-26: 30 mg via INTRAVENOUS

## 2014-09-26 MED ORDER — OXYCODONE HCL 5 MG PO TABS: 5.0000 mg | ORAL_TABLET | Freq: Once | ORAL | Status: AC | PRN

## 2014-09-26 MED ORDER — PHENYLEPHRINE HCL 10 MG/ML IJ SOLN
INTRAMUSCULAR | Status: DC | PRN
  Administered 2014-09-26: 40 ug via INTRAVENOUS
  Administered 2014-09-26: 80 ug via INTRAVENOUS
  Administered 2014-09-26 (×7): 40 ug via INTRAVENOUS

## 2014-09-26 MED ORDER — PROPOFOL 10 MG/ML IV BOLUS
INTRAVENOUS | Status: DC | PRN
Start: 2014-09-26 — End: 2014-09-26
  Administered 2014-09-26: 200 mg via INTRAVENOUS

## 2014-09-26 MED ORDER — FENTANYL CITRATE (PF) 100 MCG/2ML IJ SOLN
25.0000 ug | INTRAMUSCULAR | Status: DC | PRN
  Administered 2014-09-26: 50 ug via INTRAVENOUS

## 2014-09-26 MED ORDER — LACTATED RINGERS IV SOLN
INTRAVENOUS | Status: DC
  Administered 2014-09-26 (×2): via INTRAVENOUS

## 2014-09-26 MED ORDER — PHENYLEPHRINE 40 MCG/ML (10ML) SYRINGE FOR IV PUSH (FOR BLOOD PRESSURE SUPPORT)
PREFILLED_SYRINGE | INTRAVENOUS | Status: AC
  Filled 2014-09-26: qty 10

## 2014-09-26 MED ORDER — FENTANYL CITRATE (PF) 100 MCG/2ML IJ SOLN
INTRAMUSCULAR | Status: AC
  Filled 2014-09-26: qty 2

## 2014-09-26 MED ORDER — DEXAMETHASONE SODIUM PHOSPHATE 10 MG/ML IJ SOLN
INTRAMUSCULAR | Status: DC | PRN
  Administered 2014-09-26: 4 mg via INTRAVENOUS

## 2014-09-26 MED ORDER — MIDAZOLAM HCL 2 MG/2ML IJ SOLN
INTRAMUSCULAR | Status: DC | PRN
  Administered 2014-09-26: 2 mg via INTRAVENOUS

## 2014-09-26 MED ORDER — LIDOCAINE HCL (CARDIAC) 20 MG/ML IV SOLN
INTRAVENOUS | Status: DC | PRN
  Administered 2014-09-26: 50 mg via INTRAVENOUS

## 2014-09-26 MED ORDER — ONDANSETRON HCL 4 MG/2ML IJ SOLN: 4.0000 mg | Freq: Once | INTRAMUSCULAR | Status: AC | PRN

## 2014-09-26 MED ORDER — KETOROLAC TROMETHAMINE 30 MG/ML IJ SOLN: 30.0000 mg | Freq: Once | INTRAMUSCULAR | Status: AC | PRN

## 2014-09-26 MED ORDER — SCOPOLAMINE 1 MG/3DAYS TD PT72
1.0000 | MEDICATED_PATCH | Freq: Once | TRANSDERMAL | Status: DC
  Administered 2014-09-26: 1.5 mg via TRANSDERMAL

## 2014-09-26 MED ORDER — MEPERIDINE HCL 25 MG/ML IJ SOLN: 6.2500 mg | INTRAMUSCULAR | Status: DC | PRN

## 2014-09-26 MED ORDER — OXYCODONE-ACETAMINOPHEN 5-325 MG PO TABS: 1.0000 | ORAL_TABLET | ORAL | Status: DC | PRN

## 2014-09-26 MED ORDER — LIDOCAINE HCL 1 % IJ SOLN
INTRAMUSCULAR | Status: AC
  Filled 2014-09-26: qty 40

## 2014-09-26 MED ORDER — LIDOCAINE HCL 1 % IJ SOLN
INTRAMUSCULAR | Status: DC | PRN
  Administered 2014-09-26: 10 mL

## 2014-09-26 MED ORDER — DEXAMETHASONE SODIUM PHOSPHATE 4 MG/ML IJ SOLN
INTRAMUSCULAR | Status: AC
  Filled 2014-09-26: qty 1

## 2014-09-26 MED ORDER — ONDANSETRON HCL 4 MG/2ML IJ SOLN
INTRAMUSCULAR | Status: AC
  Filled 2014-09-26: qty 2

## 2014-09-26 MED ORDER — FENTANYL CITRATE (PF) 100 MCG/2ML IJ SOLN
INTRAMUSCULAR | Status: DC | PRN
  Administered 2014-09-26 (×2): 100 ug via INTRAVENOUS

## 2014-09-26 SURGICAL SUPPLY — 17 items
CATH ROBINSON RED A/P 16FR (CATHETERS) ×2 IMPLANT
CLOTH BEACON ORANGE TIMEOUT ST (SAFETY) ×2 IMPLANT
CONTAINER PREFILL 10% NBF 60ML (FORM) ×4 IMPLANT
DEVICE MYOSURE CLASSIC (MISCELLANEOUS) IMPLANT
DEVICE MYOSURE LITE (MISCELLANEOUS) IMPLANT
FILTER ARTHROSCOPY CONVERTOR (FILTER) ×2 IMPLANT
GLOVE BIO SURGEON STRL SZ7.5 (GLOVE) ×4 IMPLANT
GOWN STRL REUS W/TWL LRG LVL3 (GOWN DISPOSABLE) ×4 IMPLANT
MYOSURE XL FIBROID REM (MISCELLANEOUS) ×2
PACK VAGINAL MINOR WOMEN LF (CUSTOM PROCEDURE TRAY) ×2 IMPLANT
PAD OB MATERNITY 4.3X12.25 (PERSONAL CARE ITEMS) ×2 IMPLANT
SEAL ROD LENS SCOPE MYOSURE (ABLATOR) ×2 IMPLANT
SYSTEM TISS REMOVAL MYSR XL RM (MISCELLANEOUS) IMPLANT
TOWEL OR 17X24 6PK STRL BLUE (TOWEL DISPOSABLE) ×4 IMPLANT
TUBING AQUILEX INFLOW (TUBING) ×2 IMPLANT
TUBING AQUILEX OUTFLOW (TUBING) ×2 IMPLANT
WATER STERILE IRR 1000ML POUR (IV SOLUTION) ×2 IMPLANT

## 2014-09-26 NOTE — Transfer of Care (Signed)
Immediate Anesthesia Transfer of Care Note  Patient: Debra Hicks  Procedure(s) Performed: Procedure(s): DILATATION & CURETTAGE/HYSTEROSCOPY WITH MYOSURE (N/A)  Patient Location: PACU  Anesthesia Type:General  Level of Consciousness: awake, alert  and oriented  Airway & Oxygen Therapy: Patient Spontanous Breathing and Patient connected to nasal cannula oxygen  Post-op Assessment: Report given to RN and Post -op Vital signs reviewed and stable  Post vital signs: Reviewed and stable  Last Vitals:  Filed Vitals:   09/26/14 1134  BP: 123/81  Pulse: 90  Temp: 37 C  Resp: 18    Complications: No apparent anesthesia complications

## 2014-09-26 NOTE — Op Note (Signed)
Debra Hicks 1970-05-31 [id number]   Post Operative Note   Date of surgery:  09/26/2014  Pre Op Dx:  Menorrhagia, leiomyoma, submucous myoma  Post Op Dx:  Menorrhagia, leiomyoma, submucous myoma  Procedure:  Hysteroscopy D&C with Myosure resection submucous myoma  Surgeon:  Donalynn Furlong P  Anesthesia:  General  EBL:  minimal  Distended media discrepancy:  2297 cc saline  Complications:  None  Specimen:  #1 submucous myoma fragments #2 endometrial curetting to pathology  Findings: EUA:  External BUS vagina normal. Cervix normal. Uterus 16 week size irregular consistent with history of leiomyoma. Adnexa difficult to evaluate but without gross masses.   Hysteroscopy:  With large submucous myoma covering posterior wall endometrial cavity with approximately 50% within the cavity. Smaller submucous myoma right lateral upper cervical canal/lower uterine segment. Fundus, anterior/posterior uterine surfaces, lower uterine segment, endocervical canal, right/left tubal ostia all visualized  Procedure:  The patient was taken to the operating room, placed in the low lithotomy position,underwent general anesthesia, received a vaginal/perineal preparation with Betadine solution per nursing personnel and an in and out Foley catheterization was performed in sterile technique. The timeout was performed by the surgical team.  The patient was draped in the usual fashion. The cervix was visualized with a speculum, anterior lip grasped with a single-tooth tenaculum and a paracervical block using 1% lidocaine 10 cc was placed. The cervix was dilated to admit the larger Myosure hysteroscope and hysteroscopy was performed with findings noted above. Using the XL Myosure resectoscopic wand the large posterior myoma was resected sequentially estimating over 50% removed. The cavity pressure was repeatedly reduced to allow more myoma to protrude into the cavity until no more myoma would present above the level  of the surrounding endometrium. Attention was then turned to the right lower uterine segment/upper cervical canal myoma and this was resected in its entirety to the level of the surrounding tissue. A gentle sharp curettage was performed and the specimen was sent separately to pathology. Repeat hysteroscopy showed adequate hemostasis and no evidence of perforation. The instruments were removed and adequate hemostasis was visualized at the tenaculum site and external cervical os. Patient was placed in the supine position, received intraoperative Toradol, was awakened without difficulty and taken to recovery in good condition having tolerated procedure well.     Anastasio Auerbach MD, 2:01 PM 09/26/2014

## 2014-09-26 NOTE — Anesthesia Procedure Notes (Signed)
Procedure Name: LMA Insertion Date/Time: 09/26/2014 12:57 PM Performed by: Jonna Munro Pre-anesthesia Checklist: Patient identified, Emergency Drugs available, Suction available, Patient being monitored and Timeout performed Patient Re-evaluated:Patient Re-evaluated prior to inductionOxygen Delivery Method: Circle system utilized Preoxygenation: Pre-oxygenation with 100% oxygen Intubation Type: IV induction LMA: LMA inserted LMA Size: 3.0 Number of attempts: 1 Placement Confirmation: positive ETCO2 and breath sounds checked- equal and bilateral Dental Injury: Teeth and Oropharynx as per pre-operative assessment

## 2014-09-26 NOTE — H&P (Signed)
  The patient was examined.  I reviewed the proposed surgery and consent form with the patient.  The dictated history and physical is current and accurate and all questions were answered. The patient is ready to proceed with surgery and has a realistic understanding and expectation for the outcome.   Anastasio Auerbach MD, 12:31 PM 09/26/2014

## 2014-09-26 NOTE — Discharge Instructions (Signed)
° °  Postoperative Instructions Hysteroscopy D & C  Dr. Phineas Real and the nursing staff have discussed postoperative instructions with you.  If you have any questions please ask them before you leave the hospital, or call Dr Elisabeth Most office at [phone].    We would like to emphasize the following instructions:  TAKE OFF PATCH BEHIND YOUR EAR IN 48 HOURS!!  [place] HANDS REALLY WELL AFTERWARD!!! MAY TAKE IBUPROFEN AFTER 7:30 PM FOR PAIN!!!  ? Call the office to make your follow-up appointment as recommended by Dr Phineas Real (usually 1-2 weeks).  ? You were given a prescription, or one was ordered for you at the pharmacy you designated.  Get that prescription filled and take the medication according to instructions.  ? You may eat a regular diet, but slowly until you start having bowel movements.  ? Drink plenty of water daily.  ? Nothing in the vagina (intercourse, douching, objects of any kind) for two weeks.  When reinitiating intercourse, if it is uncomfortable, stop and make an appointment with Dr Phineas Real to be evaluated.  ? No driving for one to two days until the effects of anesthesia has worn off.  No traveling out of town for several days.  ? You may shower, but no baths for one week.  Walking up and down stairs is ok.  No heavy lifting, prolonged standing, repeated bending or any working out until your post op check.  ? Rest frequently, listen to your body and do not push yourself and overdo it.  ? Call if:  o Your pain medication does not seem strong enough. o Worsening pain or abdominal bloating o Persistent nausea or vomiting o Difficulty with urination or bowel movements. o Temperature of 101 degrees or higher. o Heavy vaginal bleeding.  If your period is due, you may use tampons. o You have any questions or concerns

## 2014-09-26 NOTE — Anesthesia Preprocedure Evaluation (Signed)
Anesthesia Evaluation  Patient identified by MRN, date of birth, ID band Patient awake    Reviewed: Allergy & Precautions, H&P , NPO status , Patient's Chart, lab work & pertinent test results, reviewed documented beta blocker date and time   Airway Mallampati: I  TM Distance: >3 FB Neck ROM: full    Dental no notable dental hx. (+) Teeth Intact   Pulmonary neg pulmonary ROS,    Pulmonary exam normal       Cardiovascular negative cardio ROS Normal cardiovascular exam    Neuro/Psych    GI/Hepatic negative GI ROS, Neg liver ROS,   Endo/Other  negative endocrine ROS  Renal/GU negative Renal ROS     Musculoskeletal   Abdominal Normal abdominal exam  (+)   Peds  Hematology   Anesthesia Other Findings   Reproductive/Obstetrics negative OB ROS                             Anesthesia Physical Anesthesia Plan  ASA: II  Anesthesia Plan: General   Post-op Pain Management:    Induction: Intravenous  Airway Management Planned: LMA  Additional Equipment:   Intra-op Plan:   Post-operative Plan:   Informed Consent: I have reviewed the patients History and Physical, chart, labs and discussed the procedure including the risks, benefits and alternatives for the proposed anesthesia with the patient or authorized representative who has indicated his/her understanding and acceptance.     Plan Discussed with: CRNA and Surgeon  Anesthesia Plan Comments:         Anesthesia Quick Evaluation

## 2014-09-27 NOTE — Anesthesia Postprocedure Evaluation (Signed)
Anesthesia Post Note  Patient: Debra Hicks  Procedure(s) Performed: Procedure(s) (LRB): DILATATION & CURETTAGE/HYSTEROSCOPY WITH MYOSURE (N/A)  Anesthesia type: General  Patient location: PACU  Post pain: Pain level controlled  Post assessment: Post-op Vital signs reviewed  Last Vitals:  Filed Vitals:   09/26/14 1430  BP: 114/74  Pulse: 73  Temp: 36.9 C  Resp: 19    Post vital signs: Reviewed  Level of consciousness: sedated  Complications: No apparent anesthesia complications

## 2014-09-29 ENCOUNTER — Encounter (HOSPITAL_COMMUNITY): Payer: Self-pay | Admitting: Gynecology

## 2014-09-29 ENCOUNTER — Telehealth: Payer: Self-pay

## 2014-09-29 MED ORDER — CIPROFLOXACIN HCL 250 MG PO TABS: 250.0000 mg | ORAL_TABLET | Freq: Two times a day (BID) | ORAL | Status: DC

## 2014-09-29 MED ORDER — ONDANSETRON HCL 4 MG PO TABS: 4.0000 mg | ORAL_TABLET | Freq: Three times a day (TID) | ORAL | Status: DC | PRN

## 2014-09-29 NOTE — Telephone Encounter (Signed)
Patient said she had surgery on Friday and is feeling extremely nauseated and tired. She said she would have thought affects of anesthesia would be gone by now.

## 2014-09-29 NOTE — Telephone Encounter (Signed)
Patient advised. I warned her that I received contraindication warning for the Ciprofloxin and her muscle relaxant that is could cause hypotension and dizziness if taken together. She said this is not something she is using now or uses daily. She will avoid using it while she is on the Cipro. Rx sent for Cipro and Zofran. Patient will call if she does not feel better in the next day or so.

## 2014-09-29 NOTE — Telephone Encounter (Signed)
In discussion regarding was pain increasing in abd. She said no pain but a lot of pressure that is really uncomfortable. She is experiencing frequency with urination "I go all the time" and a slight burning when she urinates. No obvious fever.

## 2014-09-29 NOTE — Telephone Encounter (Signed)
Sometimes it can last longer in patients. If she would like something for nausea Zofran 4 mg tablet #20 one by mouth every 8 hours when necessary nausea. Slowly she is doing well otherwise as far as no fevers & increasing abdominal pain then I would monitor for now and push the fluids.

## 2014-09-29 NOTE — Telephone Encounter (Signed)
We do catheterize the patient before the procedure. I want to cover her for an early UTI with ciprofloxacin 250 mg twice a day 5 days.

## 2014-09-30 ENCOUNTER — Telehealth: Payer: Self-pay | Admitting: *Deleted

## 2014-09-30 NOTE — Telephone Encounter (Signed)
Pt had D&C on 09/26/14 states she has lower right side discomfort nagging feeling, states it started the night she took the cytotec tablet. Not painful just annoying asked if this normal? Could it be related to fibroid on right side. Please advise

## 2014-09-30 NOTE — Telephone Encounter (Signed)
I suspect the discomfort is due to the Cytotec causing uterine contractions around this fibroid which probably irritated that area. I would go with ibuprofen 600 mg every 6-8 hours and I suspect that this will calm this area down.  I sent a note out with her pathology results that was benign and you can tell her that also at the same time.

## 2014-09-30 NOTE — Telephone Encounter (Signed)
Pt informed with all the below 

## 2014-10-02 ENCOUNTER — Encounter (HOSPITAL_COMMUNITY): Payer: Self-pay | Admitting: *Deleted

## 2014-10-02 ENCOUNTER — Telehealth: Payer: Self-pay | Admitting: Obstetrics and Gynecology

## 2014-10-02 ENCOUNTER — Inpatient Hospital Stay (HOSPITAL_COMMUNITY)
Admission: AD | Admit: 2014-10-02 | Discharge: 2014-10-02 | Disposition: A | Discharge status: Home / Self Care | Payer: 59 | Source: Ambulatory Visit | Attending: Obstetrics and Gynecology | Admitting: Obstetrics and Gynecology

## 2014-10-02 DIAGNOSIS — N939 Abnormal uterine and vaginal bleeding, unspecified: Secondary | ICD-10-CM | POA: Diagnosis present

## 2014-10-02 DIAGNOSIS — Z9889 Other specified postprocedural states: Secondary | ICD-10-CM | POA: Insufficient documentation

## 2014-10-02 DIAGNOSIS — N920 Excessive and frequent menstruation with regular cycle: Secondary | ICD-10-CM

## 2014-10-02 LAB — COMPREHENSIVE METABOLIC PANEL
ALK PHOS: 74 U/L (ref 38–126)
ALT: 38 U/L (ref 14–54)
ANION GAP: 5 (ref 5–15)
AST: 37 U/L (ref 15–41)
Albumin: 3.4 g/dL — ABNORMAL LOW (ref 3.5–5.0)
BUN: 14 mg/dL (ref 6–20)
CO2: 25 mmol/L (ref 22–32)
Calcium: 9.5 mg/dL (ref 8.9–10.3)
Chloride: 109 mmol/L (ref 101–111)
Creatinine, Ser: 1.04 mg/dL — ABNORMAL HIGH (ref 0.44–1.00)
GFR calc Af Amer: >60 mL/min (ref >60)
Glucose, Bld: 107 mg/dL — ABNORMAL HIGH (ref 65–99)
Potassium: 4.2 mmol/L (ref 3.5–5.1)
Sodium: 139 mmol/L (ref 135–145)
Total Bilirubin: 0.1 mg/dL — ABNORMAL LOW (ref 0.3–1.2)
Total Protein: 8 g/dL (ref 6.5–8.1)

## 2014-10-02 LAB — CBC WITH DIFFERENTIAL/PLATELET
Basophils Absolute: 0 10*3/uL (ref 0.0–0.1)
Basophils Relative: 0 % (ref 0–1)
Eosinophils Absolute: 0.7 10*3/uL (ref 0.0–0.7)
Eosinophils Relative: 7 % — ABNORMAL HIGH (ref 0–5)
HCT: 33.8 % — ABNORMAL LOW (ref 36.0–46.0)
HEMOGLOBIN: 12.3 g/dL (ref 12.0–15.0)
LYMPHS PCT: 28 % (ref 12–46)
Lymphs Abs: 2.8 10*3/uL (ref 0.7–4.0)
MCH: 28.7 pg (ref 26.0–34.0)
MCHC: 36.4 g/dL — AB (ref 30.0–36.0)
MCV: 78.8 fL (ref 78.0–100.0)
Monocytes Absolute: 0.7 10*3/uL (ref 0.1–1.0)
Monocytes Relative: 7 % (ref 3–12)
NEUTROS ABS: 5.6 10*3/uL (ref 1.7–7.7)
Neutrophils Relative %: 58 % (ref 43–77)
PLATELETS: 425 10*3/uL — AB (ref 150–400)
RBC: 4.29 MIL/uL (ref 3.87–5.11)
RDW: 14.4 % (ref 11.5–15.5)
WBC: 9.7 10*3/uL (ref 4.0–10.5)

## 2014-10-02 MED ORDER — POLYETHYLENE GLYCOL 3350 17 G PO PACK: 17.0000 g | PACK | Freq: Every day | ORAL | Status: DC

## 2014-10-02 NOTE — MAU Note (Signed)
Received patient via EMS having bleeding tonight that began at 0230. Pt states that she was on the toilet and passed out for a minute while daughter was present. Pt had a hysteroscopy for fibroid resection on 6/3 with no problems until tonight. Pt states that she is anemic but stopped taking iron as she had a UTI and was told not to take antibiotics with iron.

## 2014-10-02 NOTE — Discharge Instructions (Signed)
Hysteroscopy Hysteroscopy is a procedure used for looking inside the womb (uterus). It may be done for various reasons, including:  To evaluate abnormal bleeding, fibroid (benign, noncancerous) tumors, polyps, scar tissue (adhesions), and possibly cancer of the uterus.  To look for lumps (tumors) and other uterine growths.  To look for causes of why a woman cannot get pregnant (infertility), causes of recurrent loss of pregnancy (miscarriages), or a lost intrauterine device (IUD).  To perform a sterilization by blocking the fallopian tubes from inside the uterus. In this procedure, a thin, flexible tube with a tiny light and camera on the end of it (hysteroscope) is used to look inside the uterus. A hysteroscopy should be done right after a menstrual period to be sure you are not pregnant. LET [facility] CARE PROVIDER KNOW ABOUT:   Any allergies you have.  All medicines you are taking, including vitamins, herbs, eye drops, creams, and over-the-counter medicines.  Previous problems you or members of your family have had with the use of anesthetics.  Any blood disorders you have.  Previous surgeries you have had.  Medical conditions you have. RISKS AND COMPLICATIONS  Generally, this is a safe procedure. However, as with any procedure, complications can occur. Possible complications include:  Putting a hole in the uterus.  Excessive bleeding.  Infection.  Damage to the cervix.  Injury to other organs.  Allergic reaction to medicines.  Too much fluid used in the uterus for the procedure. BEFORE THE PROCEDURE   Ask your health care provider about changing or stopping any regular medicines.  Do not take aspirin or blood thinners for 1 week before the procedure, or as directed by your health care provider. These can cause bleeding.  If you smoke, do not smoke for 2 weeks before the procedure.  In some cases, a medicine is placed in the cervix the day before the procedure.  This medicine makes the cervix have a larger opening (dilate). This makes it easier for the instrument to be inserted into the uterus during the procedure.  Do not eat or drink anything for at least 8 hours before the surgery.  Arrange for someone to take you home after the procedure. PROCEDURE   You may be given a medicine to relax you (sedative). You may also be given one of the following:  A medicine that numbs the area around the cervix (local anesthetic).  A medicine that makes you sleep through the procedure (general anesthetic).  The hysteroscope is inserted through the vagina into the uterus. The camera on the hysteroscope sends a picture to a TV screen. This gives the surgeon a good view inside the uterus.  During the procedure, air or a liquid is put into the uterus, which allows the surgeon to see better.  Sometimes, tissue is gently scraped from inside the uterus. These tissue samples are sent to a lab for testing. AFTER THE PROCEDURE   If you had a general anesthetic, you may be groggy for a couple hours after the procedure.  If you had a local anesthetic, you will be able to go home as soon as you are stable and feel ready.  You may have some cramping. This normally lasts for a couple days.  You may have bleeding, which varies from light spotting for a few days to menstrual-like bleeding for 3-7 days. This is normal.  If your test results are not back during the visit, make an appointment with your health care provider to find out the  results. Document Released: 07/18/2000 Document Revised: 01/30/2013 Document Reviewed: 11/08/2012 Exit[employer] Patient Information 2015 [employer]. This information is not intended to replace advice given to you by your health care provider. Make sure you discuss any questions you have with your health care provider.   
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
## 2014-10-02 NOTE — Telephone Encounter (Signed)
Phone call from patient after hours.  Patient calling with heavy bleeding into the toilet and cramping into her legs. Status post hysteroscopy with Myosure resection of submucous fibroid, dilation and curettage on 09/26/14. Patient states prior LMP wa 09/03/14.  Not taking any hormonal treatment. Felt chilled today.   During conversation, phone became quiet and another female responded to me by phone, "She just fainted." She indicates that the patient is responsive.  I recommended hanging up phone and calling 911 so patient could be brought to hospital for evaluation and care.  She agrees to doing this now.   MAU called and informed patient will be arriving by EMS.

## 2014-10-02 NOTE — MAU Note (Signed)
IV d/c cathetar removed and intact.

## 2014-10-03 ENCOUNTER — Ambulatory Visit (INDEPENDENT_AMBULATORY_CARE_PROVIDER_SITE_OTHER): Payer: 59 | Admitting: Gynecology

## 2014-10-03 ENCOUNTER — Encounter: Payer: Self-pay | Admitting: Gynecology

## 2014-10-03 ENCOUNTER — Telehealth: Payer: Self-pay

## 2014-10-03 VITALS — BP 118/76 | Temp 98.9°F

## 2014-10-03 DIAGNOSIS — Z9889 Other specified postprocedural states: Secondary | ICD-10-CM

## 2014-10-03 NOTE — Progress Notes (Signed)
Debra Hicks 1970-07-20 [id number]        44 y.o.  G2P0011 presents postoperatively having undergone hysteroscopy D&C with resection of submucous myoma 2 09/26/2014. Postoperative course was complicated by episode of passage of clots with a fainting episode leading to evaluation in the hospital yesterday.  She was found to be stable with hemoglobin of 12, normal white count and discharged home. Reports that her bleeding has diminished to scant spotting. Still having some vague cramping but otherwise doing well. No fever or chills nausea vomiting diarrhea constipation. At her first bowel movement yesterday. Is eating drinking voiding without difficulties.  Past medical history,surgical history, problem list, medications, allergies, family history and social history were all reviewed and documented in the EPIC chart.  Directed ROS with pertinent positives and negatives documented in the history of present illness/assessment and plan.  Exam: Kim assistant Filed Vitals:   10/03/14 1426  BP: 118/76  Temp: 98.9 F (37.2 C)  TempSrc: Oral   General appearance:  Normal Abdomen soft nontender without masses guarding rebound Pelvic external BUS vagina normal. Cervix normal. Uterus 16+ weeks firm nontender. Adnexa without gross masses or tenderness  Assessment/Plan:  44 y.o. G2P0011 status post hysteroscopic resection submucous myomas. Reviewed benign pathology with her. Had episode of heavy bleeding 1 with a fainting episode. Evaluation was benign and she's done well since then. Patient will monitor at present and keep a menstrual calendar over the next several cycles. If her menses remain heavy or painful she'll represent to discuss possible surgery to include TAH. If any issues sooner than she'll represent ASAP such as heavy bleeding fever or chills or other symptoms.    Anastasio Auerbach MD, 4:17 PM 10/03/2014

## 2014-10-03 NOTE — Patient Instructions (Signed)
Keep a menstrual calendar over the next several cycles. Follow up with me if her periods remaining heavy or painful. Follow up sooner if any issues.

## 2014-10-03 NOTE — Telephone Encounter (Signed)
Patient called stating that she was seen in ER on Thursday at 2:30am.  I see ER note and Dr. Quincy Simmonds advised her to call and see Dr. Loetta Rough as soon as possible. She just now called. She went to ER because of extremely heavy bleeding and "stabbing pains in my abdomen".  She said that they thought she was just having her period.  She said the stabbing pains are persisting and she is still bleeding but not as heavy.  She said she "feels awful".  I advised patient to be here at 2pm and told her we will be working her in. She will be here and was happy to be seen.

## 2014-10-04 ENCOUNTER — Telehealth: Payer: Self-pay | Admitting: Gynecology

## 2014-10-04 NOTE — Telephone Encounter (Signed)
On Call Note:  S/P hysteroscopic myomectomy one week ago.  Had episode of bleeding last night with menses like flow now resolved.  Now having cramping and nausea on and off.  Eating, drinking, voiding, BM, no fevers or chills.  Eval in ER 10/02/2014 negative with normal white count and stable Hb from preop.  Eval yesterday in office normal with normal exam excepting 16 week uterus.  Reviewed with patient unusual course for apparently uncomplicated hysteroscopy.  Would expect full recovery by now.  She is due for period now and whether that is playing into this as well as large uterus with multiple myomas and whether that has sensitized her for more cramping/bleeding post op.  Doubt infection without fever, chills and recent normal white count. Options for nausea/pain medication now vs ER evaluation today reviewed and offered.  Patient comfortable with med's/ rest/ push fluids, call back precautions reviewed.  Zofran 4 mg #20 and Tramadol 50 mg # 30 called to her pharmacy with instructions.

## 2014-10-13 ENCOUNTER — Ambulatory Visit: Payer: 59 | Admitting: Gynecology

## 2014-10-14 ENCOUNTER — Ambulatory Visit: Payer: 59 | Admitting: Primary Care

## 2014-10-17 ENCOUNTER — Telehealth: Payer: Self-pay | Admitting: *Deleted

## 2014-10-17 MED ORDER — ZOLPIDEM TARTRATE 10 MG PO TABS: 10.0000 mg | ORAL_TABLET | Freq: Every evening | ORAL | Status: AC | PRN

## 2014-10-17 NOTE — Telephone Encounter (Signed)
Can try a short course of Ambien 10 mg at bedtime #15 use as needed. See if this does not break the cycle.

## 2014-10-17 NOTE — Telephone Encounter (Signed)
Pt informed with the below note,Rx called in.  

## 2014-10-17 NOTE — Telephone Encounter (Signed)
Pt called c/o sleeping issues, in last 48 hours only had about 3 hours of sleep. Pt said she is not taking any medication that would cause this, tried sleep aid and no relief. Had D &C on 09/26/14, also off & on she has cramps not unbearable, but feels like menstrual cramps. Any recommendations would be helpful. Please advise

## 2014-10-28 ENCOUNTER — Telehealth: Payer: Self-pay | Admitting: *Deleted

## 2014-10-28 ENCOUNTER — Encounter: Payer: Self-pay | Admitting: Women's Health

## 2014-10-28 ENCOUNTER — Ambulatory Visit (INDEPENDENT_AMBULATORY_CARE_PROVIDER_SITE_OTHER): Payer: 59 | Admitting: Women's Health

## 2014-10-28 VITALS — BP 132/88

## 2014-10-28 DIAGNOSIS — N920 Excessive and frequent menstruation with regular cycle: Secondary | ICD-10-CM

## 2014-10-28 MED ORDER — TRANEXAMIC ACID 650 MG PO TABS
1300.0000 mg | ORAL_TABLET | Freq: Three times a day (TID) | ORAL | Status: DC
Start: 2014-10-28 — End: 2016-06-20

## 2014-10-28 NOTE — Telephone Encounter (Signed)
(  Dr.Fontaine patient) Pt is post hysteroscopic myomectomy on 10/02/14 called stating cycle started 5 days early, c/o heavy bleeding changing tampon/pads every 45 minutes. Pt said no cramping as noted before on telephone encounter 10/17/14. Pt will be in to see you at 12:00pm

## 2014-10-28 NOTE — Progress Notes (Signed)
Patient ID: Debra Hicks, female   DOB: February 08, 1971, 44 y.o.   MRN: [id number] Presents with complaint of heavy menses, cycle 1 week early, first 2 days of cycle were light last 3 days heavy changing every 45 minutes to an hour.. 09/26/2014 hysteroscopic myomectomy. Has done well since one episode of heavy bleeding on 6/9 /2016. Denies vaginal discharge, urinary symptoms, or fever. Not sexually active.  Exam: Appears well. External genitalia within normal limits, speculum exam no visible blood in vault, one spot at cervix. Bimanual no CMT or adnexal tenderness.  First menses after myomectomy  Plan: Will watch at this time, reviewed option for Lysteda with  subsequent cycles if heavy bleeding. No history of blood clots or hypertension, slight risk for blood clots and strokes reviewed. Will keep menstrual record, normal cycles reviewed. Plan made with Dr. Phineas Real.

## 2014-12-16 ENCOUNTER — Other Ambulatory Visit: Payer: Self-pay | Admitting: Women's Health

## 2014-12-16 ENCOUNTER — Telehealth: Payer: Self-pay | Admitting: *Deleted

## 2014-12-16 MED ORDER — ZOLPIDEM TARTRATE 10 MG PO TABS: 10.0000 mg | ORAL_TABLET | Freq: Every evening | ORAL | Status: DC | PRN

## 2014-12-16 MED ORDER — ZOLPIDEM TARTRATE 10 MG PO TABS: 10.0000 mg | ORAL_TABLET | Freq: Every evening | ORAL | Status: AC | PRN

## 2014-12-16 NOTE — Telephone Encounter (Signed)
(  Dr.Fontaine patient) pt called c/o right headache x 3 days now, which causes neck and teeth discomfort.  No visual problems, tried OTC Motrin 800 mg and no relief. Pt asked if could be sinus related, pt asked if you have any recommendations. Please advise

## 2014-12-16 NOTE — Telephone Encounter (Signed)
Pt informed with the below note, pt said she is having problems sleeping and well, doesn't have PCP. Dr.Fontaine gave her Ambien 10 mg at bedtime #15 use as needed on 10/17/14 telephone encounter. She believes the lack of sleep maybe related to this as well. Please advise

## 2014-12-16 NOTE — Addendum Note (Signed)
Addended by: Thamas Jaegers on: 12/16/2014 04:25 PM   Modules accepted: Orders

## 2014-12-16 NOTE — Telephone Encounter (Signed)
Could try over-the-counter Zyrtec D, if no relief best to see primary care. Does she have a fever, cough, nasal congestion?

## 2014-12-16 NOTE — Telephone Encounter (Signed)
rx called in

## 2014-12-16 NOTE — Telephone Encounter (Signed)
Phone call, states biggest problem is insomnia states did have some rest with Ambien, would like refill. Cycles are still very heavy bleeding through pads and clothing both in July and August. States is now leaning towards a hysterectomy. Had a myomectomy in May per Dr. Phineas Real. States thinks headache is more from fatigue. Is able to go to work but having great difficulty with sleep. Sleep hygiene reviewed states is using no caffeine.  Anderson Malta please call in Ambien 10 by mouth at bedtime when necessary #15 no refills.

## 2014-12-23 ENCOUNTER — Telehealth: Payer: Self-pay | Admitting: *Deleted

## 2014-12-23 NOTE — Telephone Encounter (Signed)
Pt had hysteroscopy D&C on 09/26/14 states she had 2 cycles in July and 2 cycles in August both with heavy bleeding soaking tampon and maxi pad. Pt is taking her iron tablet as directed, states during the heavy bleeding she could change every 1-2 hours. I offered OV with you, pt asked me to relay to you to see what you think the next step should be regarding heavy bleeding. Bleeding not heavy now. Please advise

## 2014-12-24 NOTE — Telephone Encounter (Signed)
Options include hysterectomy, attempts at hormonal suppression such as Megace at the onset of her menses each month to see if doesn't lighten her cycle or low-dose oral contraceptive in a continuous fashion, Mirena IUD to see if the progesterone on the IUD doesn't suppress her bleeding are some of the options.Marland Kitchen

## 2014-12-24 NOTE — Telephone Encounter (Signed)
Pt informed with the below note, she is going to think about her options and call back

## 2014-12-25 NOTE — Telephone Encounter (Signed)
Dr.Fontaine pt called back and would like to proceed with low dose birth control pills option. Pt asked not to be prescribed Gildess 1/20 as her daughter tried this and it made her terribly sick. Please advise

## 2014-12-25 NOTE — Telephone Encounter (Signed)
Okay for Loestrin 1/20 equivalent. Her daughter's reaction should not predict hers. One pack with refills through April 2017 when she would be due for annual exam. Remind patient that main risk of oral contraceptives is increased risk of blood clots such as stroke heart attack DVT. Risk of that an otherwise healthy patient never smoked is very low.

## 2014-12-26 MED ORDER — NORETHINDRONE ACET-ETHINYL EST 1-20 MG-MCG PO TABS: 1.0000 | ORAL_TABLET | Freq: Every day | ORAL | Status: DC

## 2014-12-26 NOTE — Telephone Encounter (Signed)
Pt aware Rx sent.  

## 2015-03-14 ENCOUNTER — Telehealth: Payer: Self-pay | Admitting: Obstetrics and Gynecology

## 2015-03-14 NOTE — Telephone Encounter (Signed)
The patient called c/o heavy bleeding. She reports a h/o myomas. Her cycle started yesterday, on time, going through a super tampon every 1.5 hours. Last hgb/hct from 6/16 was 12/33. She was counseled about OCP's, never started them for fear of mood changes. Recommended she take ibuprofen 800 mg po q 8 hours, take it easy. Call if saturating a super+ tampon an hour, or if she is feeling lightheaded or dizzy. We discussed she would need to be seen in the ER if her symptoms worsen. Advised to call back any time this weekend and to call the office on Monday.

## 2015-03-17 ENCOUNTER — Telehealth: Payer: Self-pay | Admitting: *Deleted

## 2015-03-17 NOTE — Telephone Encounter (Signed)
Pt called to follow up with telephone encounter on 03/14/15 with Dr.Jerston, pt said she having light bleeding now, no pain, going to start on birth control pills this Sunday, never started pills due to fear of mood changes.

## 2015-03-26 ENCOUNTER — Telehealth: Payer: Self-pay

## 2015-03-26 ENCOUNTER — Other Ambulatory Visit: Payer: Self-pay | Admitting: Women's Health

## 2015-03-26 DIAGNOSIS — R1115 Cyclical vomiting syndrome unrelated to migraine: Secondary | ICD-10-CM

## 2015-03-26 MED ORDER — PROMETHAZINE HCL 25 MG PO TABS
25.0000 mg | ORAL_TABLET | Freq: Four times a day (QID) | ORAL | Status: DC | PRN
Start: 2015-03-26 — End: 2016-06-20

## 2015-03-26 NOTE — Telephone Encounter (Signed)
Began OC on Sunday. C/o extremely nauseated since starting. Unbearable.

## 2015-03-26 NOTE — Telephone Encounter (Signed)
Telephone call, states first month on OCs. Taking for heavy cycle, nauseous but no vomiting. Will try Phenergan 25 take prn , at bedtime or when at home reviewed may make her drowsy. Rx called in instructed to call if continued problems.

## 2015-03-26 NOTE — Telephone Encounter (Signed)
[phone] was the number she left.

## 2015-03-26 NOTE — Telephone Encounter (Signed)
Telephone number in her chart is not working- do you have a different number?

## 2015-04-08 ENCOUNTER — Telehealth: Payer: Self-pay | Admitting: *Deleted

## 2015-04-08 NOTE — Telephone Encounter (Signed)
I would try to watch for now. Continue with the Motrin. If continues then office visit for evaluation.

## 2015-04-08 NOTE — Telephone Encounter (Signed)
Pt currently taking Loestrin 1/20 states last Thursday she began to having painful cramping that radiates down leg, cramping comes and goes, LMP:03/13/15, spotting now, states sometimes cramping pain level could be 8, takes motrin 800 mg. No cramping now, pt would like your recommendations. Please advise

## 2015-04-08 NOTE — Telephone Encounter (Signed)
Pt informed with the below note. 

## 2015-06-05 ENCOUNTER — Encounter (HOSPITAL_COMMUNITY): Payer: Self-pay | Admitting: *Deleted

## 2015-06-05 ENCOUNTER — Emergency Department (HOSPITAL_COMMUNITY): Payer: Self-pay

## 2015-06-05 ENCOUNTER — Emergency Department (HOSPITAL_COMMUNITY)
Admission: EM | Admit: 2015-06-05 | Discharge: 2015-06-05 | Disposition: A | Discharge status: Home / Self Care | Payer: Self-pay | Attending: Emergency Medicine | Admitting: Emergency Medicine

## 2015-06-05 DIAGNOSIS — H748X3 Other specified disorders of middle ear and mastoid, bilateral: Secondary | ICD-10-CM | POA: Insufficient documentation

## 2015-06-05 DIAGNOSIS — D649 Anemia, unspecified: Secondary | ICD-10-CM | POA: Insufficient documentation

## 2015-06-05 DIAGNOSIS — R110 Nausea: Secondary | ICD-10-CM | POA: Insufficient documentation

## 2015-06-05 DIAGNOSIS — J069 Acute upper respiratory infection, unspecified: Secondary | ICD-10-CM | POA: Insufficient documentation

## 2015-06-05 DIAGNOSIS — Z86018 Personal history of other benign neoplasm: Secondary | ICD-10-CM | POA: Insufficient documentation

## 2015-06-05 DIAGNOSIS — H9203 Otalgia, bilateral: Secondary | ICD-10-CM | POA: Insufficient documentation

## 2015-06-05 DIAGNOSIS — Z79899 Other long term (current) drug therapy: Secondary | ICD-10-CM | POA: Insufficient documentation

## 2015-06-05 DIAGNOSIS — Z793 Long term (current) use of hormonal contraceptives: Secondary | ICD-10-CM | POA: Insufficient documentation

## 2015-06-05 MED ORDER — ONDANSETRON HCL 4 MG PO TABS: 4.0000 mg | ORAL_TABLET | Freq: Four times a day (QID) | ORAL | Status: DC

## 2015-06-05 MED ORDER — AZITHROMYCIN 250 MG PO TABS: 250.0000 mg | ORAL_TABLET | Freq: Every day | ORAL | Status: DC

## 2015-06-05 NOTE — Discharge Instructions (Signed)
[name],  Nice meeting you! Please follow-up with your primary care provider. Return to the emergency department if you develop shortness of breath, chest pain. Feel better soon!  S. Wendie Simmer, PA-C   Cough, Adult A cough helps to clear your throat and lungs. A cough may last only 2-3 weeks (acute), or it may last longer than 8 weeks (chronic). Many different things can cause a cough. A cough may be a sign of an illness or another medical condition. HOME CARE  Pay attention to any changes in your cough.  Take medicines only as told by your doctor.  If you were prescribed an antibiotic medicine, take it as told by your doctor. Do not stop taking it even if you start to feel better.  Talk with your doctor before you try using a cough medicine.  Drink enough fluid to keep your pee (urine) clear or pale yellow.  If the air is dry, use a cold steam vaporizer or humidifier in your home.  Stay away from things that make you cough at work or at home.  If your cough is worse at night, try using extra pillows to raise your head up higher while you sleep.  Do not smoke, and try not to be around smoke. If you need help quitting, ask your doctor.  Do not have caffeine.  Do not drink alcohol.  Rest as needed. GET HELP IF:  You have new problems (symptoms).  You cough up yellow fluid (pus).  Your cough does not get better after 2-3 weeks, or your cough gets worse.  Medicine does not help your cough and you are not sleeping well.  You have pain that gets worse or pain that is not helped with medicine.  You have a fever.  You are losing weight and you do not know why.  You have night sweats. GET HELP RIGHT AWAY IF:  You cough up blood.  You have trouble breathing.  Your heartbeat is very fast.   This information is not intended to replace advice given to you by your health care provider. Make sure you discuss any questions you have with your health care  provider.   Document Released: 12/23/2010 Document Revised: 12/31/2014 Document Reviewed: 06/18/2014 Elsevier Interactive Patient Education 2016 Elsevier Inc.  Upper Respiratory Infection, Adult Most upper respiratory infections (URIs) are a viral infection of the air passages leading to the lungs. A URI affects the nose, throat, and upper air passages. The most common type of URI is nasopharyngitis and is typically referred to as "the common cold." URIs run their course and usually go away on their own. Most of the time, a URI does not require medical attention, but sometimes a bacterial infection in the upper airways can follow a viral infection. This is called a secondary infection. Sinus and middle ear infections are common types of secondary upper respiratory infections. Bacterial pneumonia can also complicate a URI. A URI can worsen asthma and chronic obstructive pulmonary disease (COPD). Sometimes, these complications can require emergency medical care and may be life threatening.  CAUSES Almost all URIs are caused by viruses. A virus is a type of germ and can spread from one person to another.  RISKS FACTORS You may be at risk for a URI if:   You smoke.   You have chronic heart or lung disease.  You have a weakened defense (immune) system.   You are very young or very old.   You have nasal allergies  or asthma.  You work in crowded or poorly ventilated areas.  You work in health care facilities or schools. SIGNS AND SYMPTOMS  Symptoms typically develop 2-3 days after you come in contact with a cold virus. Most viral URIs last 7-10 days. However, viral URIs from the influenza virus (flu virus) can last 14-18 days and are typically more severe. Symptoms may include:   Runny or stuffy (congested) nose.   Sneezing.   Cough.   Sore throat.   Headache.   Fatigue.   Fever.   Loss of appetite.   Pain in your forehead, behind your eyes, and over your cheekbones  (sinus pain).  Muscle aches.  DIAGNOSIS  Your health care provider may diagnose a URI by:  Physical exam.  Tests to check that your symptoms are not due to another condition such as:  Strep throat.  Sinusitis.  Pneumonia.  Asthma. TREATMENT  A URI goes away on its own with time. It cannot be cured with medicines, but medicines may be prescribed or recommended to relieve symptoms. Medicines may help:  Reduce your fever.  Reduce your cough.  Relieve nasal congestion. HOME CARE INSTRUCTIONS   Take medicines only as directed by your health care provider.   Gargle warm saltwater or take cough drops to comfort your throat as directed by your health care provider.  Use a warm mist humidifier or inhale steam from a shower to increase air moisture. This may make it easier to breathe.  Drink enough fluid to keep your urine clear or pale yellow.   Eat soups and other clear broths and maintain good nutrition.   Rest as needed.   Return to work when your temperature has returned to normal or as your health care provider advises. You may need to stay home longer to avoid infecting others. You can also use a face mask and careful hand washing to prevent spread of the virus.  Increase the usage of your inhaler if you have asthma.   Do not use any tobacco products, including cigarettes, chewing tobacco, or electronic cigarettes. If you need help quitting, ask your health care provider. PREVENTION  The best way to protect yourself from getting a cold is to practice good hygiene.   Avoid oral or hand contact with people with cold symptoms.   Wash your hands often if contact occurs.  There is no clear evidence that vitamin C, vitamin E, echinacea, or exercise reduces the chance of developing a cold. However, it is always recommended to get plenty of rest, exercise, and practice good nutrition.  SEEK MEDICAL CARE IF:   You are getting worse rather than better.   Your  symptoms are not controlled by medicine.   You have chills.  You have worsening shortness of breath.  You have brown or red mucus.  You have yellow or brown nasal discharge.  You have pain in your face, especially when you bend forward.  You have a fever.  You have swollen neck glands.  You have pain while swallowing.  You have white areas in the back of your throat. SEEK IMMEDIATE MEDICAL CARE IF:   You have severe or persistent:  Headache.  Ear pain.  Sinus pain.  Chest pain.  You have chronic lung disease and any of the following:  Wheezing.  Prolonged cough.  Coughing up blood.  A change in your usual mucus.  You have a stiff neck.  You have changes in your:  Vision.  Hearing.  Thinking.  Mood. MAKE SURE YOU:   Understand these instructions.  Will watch your condition.  Will get help right away if you are not doing well or get worse.   This information is not intended to replace advice given to you by your health care provider. Make sure you discuss any questions you have with your health care provider.   Document Released: 10/05/2000 Document Revised: 08/26/2014 Document Reviewed: 07/17/2013 Elsevier Interactive Patient Education [employer].

## 2015-06-05 NOTE — ED Notes (Signed)
sorethroat with a cold productive cough yellow thick with specks of blood in her sputum    Chest congestion  Elevated temp .  Last night she took  Motrin  None today.
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
## 2015-12-14 DIAGNOSIS — R51 Headache: Secondary | ICD-10-CM | POA: Insufficient documentation

## 2015-12-14 DIAGNOSIS — Z5321 Procedure and treatment not carried out due to patient leaving prior to being seen by health care provider: Secondary | ICD-10-CM | POA: Insufficient documentation

## 2015-12-15 ENCOUNTER — Encounter (HOSPITAL_COMMUNITY): Payer: Self-pay | Admitting: *Deleted

## 2015-12-15 ENCOUNTER — Emergency Department (HOSPITAL_COMMUNITY)
Admission: EM | Admit: 2015-12-15 | Discharge: 2015-12-15 | Disposition: A | Discharge status: Home / Self Care | Payer: Self-pay | Attending: Dermatology | Admitting: Dermatology

## 2015-12-15 LAB — COMPREHENSIVE METABOLIC PANEL
ALT: 18 U/L (ref 14–54)
ANION GAP: 8 (ref 5–15)
AST: 15 U/L (ref 15–41)
Albumin: 4.4 g/dL (ref 3.5–5.0)
Alkaline Phosphatase: 57 U/L (ref 38–126)
BUN: 9 mg/dL (ref 6–20)
CALCIUM: 9.1 mg/dL (ref 8.9–10.3)
CO2: 20 mmol/L — ABNORMAL LOW (ref 22–32)
Chloride: 111 mmol/L (ref 101–111)
Creatinine, Ser: 0.76 mg/dL (ref 0.44–1.00)
GFR calc non Af Amer: >60 mL/min (ref >60)
Glucose, Bld: 89 mg/dL (ref 65–99)
Potassium: 3.4 mmol/L — ABNORMAL LOW (ref 3.5–5.1)
SODIUM: 139 mmol/L (ref 135–145)
Total Bilirubin: 0.6 mg/dL (ref 0.3–1.2)
Total Protein: 8.2 g/dL — ABNORMAL HIGH (ref 6.5–8.1)

## 2015-12-15 LAB — I-STAT BETA HCG BLOOD, ED (MC, WL, AP ONLY)

## 2015-12-15 LAB — CBC
HCT: 37.8 % (ref 36.0–46.0)
HEMOGLOBIN: 13.5 g/dL (ref 12.0–15.0)
MCH: 27.7 pg (ref 26.0–34.0)
MCHC: 35.7 g/dL (ref 30.0–36.0)
MCV: 77.5 fL — ABNORMAL LOW (ref 78.0–100.0)
PLATELETS: 508 10*3/uL — AB (ref 150–400)
RBC: 4.88 MIL/uL (ref 3.87–5.11)
RDW: 14.2 % (ref 11.5–15.5)
WBC: 10.1 10*3/uL (ref 4.0–10.5)

## 2015-12-15 LAB — LIPASE, BLOOD: LIPASE: 33 U/L (ref 11–51)

## 2015-12-15 NOTE — ED Triage Notes (Signed)
Pt arrives to the ER via EMS for complaints of Headache, N/V/D; pt states that she began with a headache 2 days ago; pt reports numerous episodes of diarrhea today; pt states that she has nausea and has had 1 episode of dry heaves

## 2015-12-15 NOTE — ED Notes (Signed)
Pt advised registration that she was leaving and no longer wants to wait to be seen

## 2016-06-20 ENCOUNTER — Ambulatory Visit (INDEPENDENT_AMBULATORY_CARE_PROVIDER_SITE_OTHER): Payer: BLUE CROSS/BLUE SHIELD | Admitting: Gynecology

## 2016-06-20 ENCOUNTER — Encounter: Payer: Self-pay | Admitting: Gynecology

## 2016-06-20 VITALS — BP 110/64 | Ht 64.0 in | Wt 163.0 lb

## 2016-06-20 DIAGNOSIS — D259 Leiomyoma of uterus, unspecified: Secondary | ICD-10-CM

## 2016-06-20 DIAGNOSIS — N920 Excessive and frequent menstruation with regular cycle: Secondary | ICD-10-CM | POA: Diagnosis not present

## 2016-06-20 DIAGNOSIS — R102 Pelvic and perineal pain: Secondary | ICD-10-CM | POA: Diagnosis not present

## 2016-06-20 DIAGNOSIS — Z01411 Encounter for gynecological examination (general) (routine) with abnormal findings: Secondary | ICD-10-CM

## 2016-06-20 DIAGNOSIS — Z13220 Encounter for screening for lipoid disorders: Secondary | ICD-10-CM | POA: Diagnosis not present

## 2016-06-20 MED ORDER — IBUPROFEN 800 MG PO TABS: 800.0000 mg | ORAL_TABLET | Freq: Three times a day (TID) | ORAL | 2 refills | Status: DC | PRN

## 2016-06-20 NOTE — Progress Notes (Signed)
    Debra Hicks Dec 10, 1970 [id number]        46 y.o.  G2P0011 for annual exam.  Complaining of heavy menses with bleedthrough episodes on a monthly basis. Menses are regular with no intermenstrual bleeding. 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 Hysteroscopic submucous myomectomy 2 years ago. Also having pain particularly at night when she is laying in bed in her pelvis with radiation down her legs when she shifts position.  Past medical history,surgical history, problem list, medications, allergies, family history and social history were all reviewed and documented as reviewed in the EPIC chart.  ROS:  Performed with pertinent positives and negatives included in the history, assessment and plan.   Additional significant findings :  None   Exam: Caryn Bee assistant Vitals:   06/20/16 0911  BP: 110/64  Weight: 163 lb (73.9 kg)  Height: 5\' 4"  (1.626 m)   Body mass index is 27.98 kg/m.  General appearance:  Normal affect, orientation and appearance. Skin: Grossly normal HEENT: Without gross lesions.  No cervical or supraclavicular adenopathy. Thyroid normal.  Lungs:  Clear without wheezing, rales or rhonchi Cardiac: RR, without RMG Abdominal:  Soft, nontender, without masses, guarding, rebound, organomegaly or hernia Breasts:  Examined lying and sitting without masses, retractions, discharge or axillary adenopathy. Pelvic:  Ext, BUS, Vagina: Normal  Cervix: Normal  Uterus: 16 week size irregular bulky  Adnexa: Without gross masses or tenderness  but exam limited by bulky uterus  Anus and perineum: Normal   Rectovaginal: Normal sphincter tone without palpated masses or tenderness.    Assessment/Plan:  46 y.o. G93P0011 female for annual exam with regular menses, abstinent contraception.   1. Menorrhagia, bulky uterus consistent with her history of leiomyoma. Now with increasing pelvic pain particularly at night. Will start with ultrasound to rule out ovarian  process. Options for management include hormonal suppression, Mirena IUD, DepoLupron, Lysteda, uterine artery embolization, myomectomy either open or to consider robotic and hysterectomy all discussed. Patient will follow up for ultrasound and will review options again. We'll check baseline hemoglobin. 2. Mammography never. Recommended screening mammogram now and patient agrees to arrange. Names and numbers provided. SBE monthly reviewed. 3. Pap smear/HPV 07/2014. No Pap smear done today. No history of significant abnormal Pap smears previously. Plan repeat Pap smear at 5 year interval per current screening guidelines. 4. Health maintenance. Baseline CBC, CMP, lipid profile and urinalysis ordered. Patient's going to have drawn when she comes in for ultrasound as lab tech is out of the office today. Follow up for ultrasound and lab work. Follow up with decision about management. Ibuprofen 800 mg #30 with 2 refills provided for nighttime discomfort.   Anastasio Auerbach MD, 9:49 AM 06/20/2016

## 2016-06-20 NOTE — Patient Instructions (Signed)
Follow-up for ultrasound as scheduled.   Call to Schedule your mammogram  Facilities in [place]: 1)  [facility]. [facility], [address] Phone: [phone] 2)  Dr. Bertrand at [facility]  [address] Phone: [phone]     Mammogram A mammogram is an X-ray test to find changes in a woman's breast. You should get a mammogram if:  You are 46 years of age or older  You have risk factors.   Your doctor recommends that you have one.  BEFORE THE TEST  Do not schedule the test the week before your period, especially if your breasts are sore during this time.  On the day of your mammogram:  Wash your breasts and armpits well. After washing, do not put on any deodorant or talcum powder on until after your test.   Eat and drink as you usually do.   Take your medicines as usual.   If you are diabetic and take insulin, make sure you:   Eat before coming for your test.   Take your insulin as usual.   If you cannot keep your appointment, call before the appointment to cancel. Schedule another appointment.  TEST  You will need to undress from the waist up. You will put on a hospital gown.   Your breast will be put on the mammogram machine, and it will press firmly on your breast with a piece of plastic called a compression paddle. This will make your breast flatter so that the machine can X-ray all parts of your breast.   Both breasts will be X-rayed. Each breast will be X-rayed from above and from the side. An X-ray might need to be taken again if the picture is not good enough.   The mammogram will last about 15 to 30 minutes.  AFTER THE TEST Finding out the results of your test Ask when your test results will be ready. Make sure you get your test results.  Document Released: 07/08/2008 Document Revised: 03/31/2011 Document Reviewed: 07/08/2008 ExitCare Patient Information 2012 [employer].   

## 2016-06-21 LAB — URINALYSIS W MICROSCOPIC + REFLEX CULTURE
Bacteria, UA: NONE SEEN [HPF]
Bilirubin Urine: NEGATIVE
CASTS: NONE SEEN [LPF]
CRYSTALS: NONE SEEN [HPF]
Glucose, UA: NEGATIVE
HGB URINE DIPSTICK: NEGATIVE
Leukocytes, UA: NEGATIVE
Nitrite: NEGATIVE
Protein, ur: NEGATIVE
SPECIFIC GRAVITY, URINE: 1.024 (ref 1.001–1.035)
Yeast: NONE SEEN [HPF]
pH: 6 (ref 5.0–8.0)

## 2016-06-22 ENCOUNTER — Other Ambulatory Visit: Payer: Self-pay | Admitting: Gynecology

## 2016-06-22 DIAGNOSIS — Z1231 Encounter for screening mammogram for malignant neoplasm of breast: Secondary | ICD-10-CM

## 2016-06-22 LAB — URINE CULTURE: ORGANISM ID, BACTERIA: NO GROWTH

## 2016-06-27 ENCOUNTER — Other Ambulatory Visit: Payer: Self-pay | Admitting: Gynecology

## 2016-06-27 ENCOUNTER — Encounter: Payer: Self-pay | Admitting: Gynecology

## 2016-06-27 ENCOUNTER — Ambulatory Visit (INDEPENDENT_AMBULATORY_CARE_PROVIDER_SITE_OTHER): Payer: BLUE CROSS/BLUE SHIELD | Admitting: Gynecology

## 2016-06-27 ENCOUNTER — Ambulatory Visit (INDEPENDENT_AMBULATORY_CARE_PROVIDER_SITE_OTHER): Payer: BLUE CROSS/BLUE SHIELD

## 2016-06-27 VITALS — BP 114/66

## 2016-06-27 DIAGNOSIS — Z13220 Encounter for screening for lipoid disorders: Secondary | ICD-10-CM | POA: Diagnosis not present

## 2016-06-27 DIAGNOSIS — D251 Intramural leiomyoma of uterus: Secondary | ICD-10-CM

## 2016-06-27 DIAGNOSIS — Z01411 Encounter for gynecological examination (general) (routine) with abnormal findings: Secondary | ICD-10-CM | POA: Diagnosis not present

## 2016-06-27 DIAGNOSIS — R102 Pelvic and perineal pain: Secondary | ICD-10-CM | POA: Diagnosis not present

## 2016-06-27 DIAGNOSIS — N852 Hypertrophy of uterus: Secondary | ICD-10-CM

## 2016-06-27 DIAGNOSIS — N920 Excessive and frequent menstruation with regular cycle: Secondary | ICD-10-CM | POA: Diagnosis not present

## 2016-06-27 DIAGNOSIS — N83201 Unspecified ovarian cyst, right side: Secondary | ICD-10-CM

## 2016-06-27 DIAGNOSIS — D259 Leiomyoma of uterus, unspecified: Secondary | ICD-10-CM

## 2016-06-27 LAB — CBC WITH DIFFERENTIAL/PLATELET
BASOS PCT: 0 %
Basophils Absolute: 0 cells/uL (ref 0–200)
EOS ABS: 395 {cells}/uL (ref 15–500)
Eosinophils Relative: 5 %
HEMATOCRIT: 37.5 % (ref 35.0–45.0)
Hemoglobin: 12.4 g/dL (ref 11.7–15.5)
LYMPHS ABS: 4266 {cells}/uL — AB (ref 850–3900)
Lymphocytes Relative: 54 %
MCH: 27.9 pg (ref 27.0–33.0)
MCHC: 33.1 g/dL (ref 32.0–36.0)
MCV: 84.3 fL (ref 80.0–100.0)
MONO ABS: 711 {cells}/uL (ref 200–950)
MPV: 9.2 fL (ref 7.5–12.5)
Monocytes Relative: 9 %
NEUTROS ABS: 2528 {cells}/uL (ref 1500–7800)
Neutrophils Relative %: 32 %
PLATELETS: 391 10*3/uL (ref 140–400)
RBC: 4.45 MIL/uL (ref 3.80–5.10)
RDW: 14.4 % (ref 11.0–15.0)
WBC: 7.9 10*3/uL (ref 3.8–10.8)

## 2016-06-27 LAB — COMPREHENSIVE METABOLIC PANEL
ALBUMIN: 3.7 g/dL (ref 3.6–5.1)
ALK PHOS: 56 U/L (ref 33–115)
ALT: 8 U/L (ref 6–29)
AST: 10 U/L (ref 10–35)
BILIRUBIN TOTAL: 0.2 mg/dL (ref 0.2–1.2)
BUN: 22 mg/dL (ref 7–25)
CO2: 24 mmol/L (ref 20–31)
CREATININE: 0.99 mg/dL (ref 0.50–1.10)
Calcium: 9.4 mg/dL (ref 8.6–10.2)
Chloride: 110 mmol/L (ref 98–110)
Glucose, Bld: 89 mg/dL (ref 65–99)
Potassium: 4.5 mmol/L (ref 3.5–5.3)
SODIUM: 139 mmol/L (ref 135–146)
TOTAL PROTEIN: 6.3 g/dL (ref 6.1–8.1)

## 2016-06-27 LAB — LIPID PANEL
CHOLESTEROL: 180 mg/dL (ref <200)
HDL: 40 mg/dL — ABNORMAL LOW (ref >50)
LDL Cholesterol: 106 mg/dL — ABNORMAL HIGH (ref <100)
Total CHOL/HDL Ratio: 4.5 Ratio (ref <5.0)
Triglycerides: 171 mg/dL — ABNORMAL HIGH (ref <150)
VLDL: 34 mg/dL — AB (ref <30)

## 2016-06-27 MED ORDER — TRANEXAMIC ACID 650 MG PO TABS: 1300.0000 mg | ORAL_TABLET | Freq: Three times a day (TID) | ORAL | 6 refills | Status: DC

## 2016-06-27 NOTE — Patient Instructions (Signed)
start on the Lysteda as we discussed. call me if this does not seem to help with the bleeding. call when you are ready to discuss hysterectomy.

## 2016-06-27 NOTE — Progress Notes (Signed)
    Debra Hicks 1971-02-13 [id number]        46 y.o.  G2P0011 presents for ultrasound. History of leiomyoma and menorrhagia with bleedthrough episodes.  Past medical history,surgical history, problem list, medications, allergies, family history and social history were all reviewed and documented in the EPIC chart.  Directed ROS with pertinent positives and negatives documented in the history of present illness/assessment and plan.  Exam: Vitals:   06/27/16 0911  BP: 114/66   General appearance:  Normal  Ultrasound: Transvaginal and transabdominal shows uterus anteverted enlarged with multiple myomas measured 81 mm, 58 mm, 41 mm, 36 mm, 35 mm, 26 mm. Right ovary with classic corpus luteal cyst 30 x 23 x 25 mm with peripheral flow. Irregular cystic lumen. Left ovary normal. Cul-de-sac negative.  Assessment/Plan:  46 y.o. G2P0011 multiple myomas and unacceptable menorrhagia. I again reviewed options with her as previously to include hormonal suppression, Mirena IUD, Depo-Lupron, Lysteda, uterine artery embolization, myomectomy either open/consider robotic and hysterectomy. Patient's leaning towards hysterectomy is unable to arrange that at this time. Will trial Lysteda and see if this does not help temporarily. Risks to include thrombosis such as stroke heart attack DVT discussed. Lysteda No. 30 with 2 by mouth 3 times a day times first 5 days of her menses with 6 refills provided. Patient will call if continues with unacceptable bleeding. Patient will call if she is ready to schedule hysterectomy.    Anastasio Auerbach MD, 9:32 AM 06/27/2016

## 2016-07-18 ENCOUNTER — Ambulatory Visit: Payer: Self-pay

## 2016-07-18 ENCOUNTER — Telehealth: Payer: Self-pay | Admitting: *Deleted

## 2016-07-18 NOTE — Telephone Encounter (Signed)
Pt said she spoke with her godmother and she mention Ambien, pt asked if you thought this would be a good option to help with sleep? If not,she is willing to try Xanax. Please advise

## 2016-07-18 NOTE — Telephone Encounter (Signed)
She had tried Xanax 0.5 mg in the past. If should like to retry this then #30 with 2 refills okay 1 by mouth at bedtime as needed.
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
## 2016-07-18 NOTE — Telephone Encounter (Signed)
I would suggest trying the Xanax first.

## 2016-07-18 NOTE — Telephone Encounter (Signed)
Pt called c/o insomnia, single parent,daughter moved to [place] recently, pt said she can fall alseep and wake up and stay up all night, then times when she can't go to sleep. No PCP, has tried melatonin,Otc natural supplements, sleep-aid, states she has tried it all. Pt said she is about to start 2 new job, pt said all the above is added stress. Pt would like to know if you have any recommendations? Please advise

## 2016-07-19 MED ORDER — ZOLPIDEM TARTRATE 10 MG PO TABS: 10.0000 mg | ORAL_TABLET | Freq: Every evening | ORAL | 0 refills | Status: AC | PRN

## 2016-07-19 NOTE — Telephone Encounter (Signed)
Pt informed, Rx called in.  

## 2016-07-19 NOTE — Telephone Encounter (Signed)
Ambien 10 mg #30 one by mouth daily at bedtime when necessary forsleep with no refills. If she requires any further medication besides this she needs to follow up with her primary physician for management.

## 2016-08-02 ENCOUNTER — Emergency Department (HOSPITAL_COMMUNITY)
Admission: EM | Admit: 2016-08-02 | Discharge: 2016-08-02 | Disposition: A | Discharge status: Home / Self Care | Payer: Self-pay | Attending: Emergency Medicine | Admitting: Emergency Medicine

## 2016-08-02 ENCOUNTER — Encounter (HOSPITAL_COMMUNITY): Payer: Self-pay

## 2016-08-02 DIAGNOSIS — T300 Burn of unspecified body region, unspecified degree: Secondary | ICD-10-CM

## 2016-08-02 DIAGNOSIS — Y92511 Restaurant or cafe as the place of occurrence of the external cause: Secondary | ICD-10-CM | POA: Insufficient documentation

## 2016-08-02 DIAGNOSIS — T2002XA Burn of unspecified degree of lip(s), initial encounter: Secondary | ICD-10-CM | POA: Insufficient documentation

## 2016-08-02 DIAGNOSIS — X100XXA Contact with hot drinks, initial encounter: Secondary | ICD-10-CM | POA: Insufficient documentation

## 2016-08-02 DIAGNOSIS — Z79899 Other long term (current) drug therapy: Secondary | ICD-10-CM | POA: Insufficient documentation

## 2016-08-02 DIAGNOSIS — J45909 Unspecified asthma, uncomplicated: Secondary | ICD-10-CM | POA: Insufficient documentation

## 2016-08-02 DIAGNOSIS — Y939 Activity, unspecified: Secondary | ICD-10-CM | POA: Insufficient documentation

## 2016-08-02 DIAGNOSIS — Y999 Unspecified external cause status: Secondary | ICD-10-CM | POA: Insufficient documentation

## 2016-08-02 MED ORDER — IBUPROFEN 600 MG PO TABS: 600.0000 mg | ORAL_TABLET | Freq: Four times a day (QID) | ORAL | 0 refills | Status: DC | PRN

## 2016-08-02 MED FILL — IBUPROFEN 600 MG TABLET: 600 | 8 days supply | Qty: 30 | Fill #0
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
## 2016-08-19 ENCOUNTER — Telehealth: Payer: Self-pay | Admitting: Obstetrics & Gynecology
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
## 2016-09-01 ENCOUNTER — Telehealth: Payer: Self-pay | Admitting: *Deleted

## 2016-09-01 MED ORDER — IBUPROFEN 800 MG PO TABS: 800.0000 mg | ORAL_TABLET | Freq: Three times a day (TID) | ORAL | 2 refills | Status: DC | PRN

## 2016-09-01 NOTE — Telephone Encounter (Signed)
Okay for Motrin 800 mg #60, one by mouth every 8 hours when necessary pain, refill 2

## 2016-09-01 NOTE — Telephone Encounter (Signed)
Pt called c/o menstrual cramps, asked if Motrin 800 mg could be sent to pharmacy? Pt said OTC dose doesn't help. Please advise

## 2016-09-01 NOTE — Telephone Encounter (Signed)
Pt informed, Rx sent. 

## 2016-10-03 ENCOUNTER — Emergency Department (HOSPITAL_COMMUNITY)
Admission: EM | Admit: 2016-10-03 | Discharge: 2016-10-03 | Disposition: A | Discharge status: Home / Self Care | Payer: BLUE CROSS/BLUE SHIELD | Attending: Emergency Medicine | Admitting: Emergency Medicine

## 2016-10-03 ENCOUNTER — Encounter (HOSPITAL_COMMUNITY): Payer: Self-pay

## 2016-10-03 ENCOUNTER — Emergency Department (HOSPITAL_COMMUNITY): Payer: BLUE CROSS/BLUE SHIELD

## 2016-10-03 DIAGNOSIS — K219 Gastro-esophageal reflux disease without esophagitis: Secondary | ICD-10-CM | POA: Insufficient documentation

## 2016-10-03 DIAGNOSIS — J45909 Unspecified asthma, uncomplicated: Secondary | ICD-10-CM | POA: Insufficient documentation

## 2016-10-03 DIAGNOSIS — R0789 Other chest pain: Secondary | ICD-10-CM | POA: Diagnosis present

## 2016-10-03 LAB — BASIC METABOLIC PANEL
ANION GAP: 8 (ref 5–15)
BUN: 9 mg/dL (ref 6–20)
CHLORIDE: 110 mmol/L (ref 101–111)
CO2: 21 mmol/L — AB (ref 22–32)
CREATININE: 0.8 mg/dL (ref 0.44–1.00)
Calcium: 9.4 mg/dL (ref 8.9–10.3)
GFR calc non Af Amer: >60 mL/min (ref >60)
Glucose, Bld: 92 mg/dL (ref 65–99)
POTASSIUM: 3.5 mmol/L (ref 3.5–5.1)
Sodium: 139 mmol/L (ref 135–145)

## 2016-10-03 LAB — CBC
HEMATOCRIT: 36.5 % (ref 36.0–46.0)
HEMOGLOBIN: 12.7 g/dL (ref 12.0–15.0)
MCH: 27.6 pg (ref 26.0–34.0)
MCHC: 34.8 g/dL (ref 30.0–36.0)
MCV: 79.3 fL (ref 78.0–100.0)
Platelets: 437 10*3/uL — ABNORMAL HIGH (ref 150–400)
RBC: 4.6 MIL/uL (ref 3.87–5.11)
RDW: 14.4 % (ref 11.5–15.5)
WBC: 8.8 10*3/uL (ref 4.0–10.5)

## 2016-10-03 LAB — I-STAT TROPONIN, ED: Troponin i, poc: 0 ng/mL (ref 0.00–0.08)

## 2016-10-03 MED ORDER — OMEPRAZOLE MAGNESIUM 20 MG PO TBEC: 20.0000 mg | DELAYED_RELEASE_TABLET | Freq: Every day | ORAL | 1 refills | Status: DC

## 2016-10-03 NOTE — Discharge Instructions (Signed)
Taking antacids as prescribed, follow-up with your primary care doctor next week to make sure her symptoms are improving

## 2016-10-03 NOTE — ED Triage Notes (Signed)
Pt states she has had discomfort in her chest this weekend. She describes the pain as someone sitting on her chest. Pt also endorses shortness of breath. Skin warm and dry. Pt has no cardiac hx. Pt reports increase in stress recently.
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
## 2016-10-03 NOTE — ED Notes (Signed)
Patient states she does not want another blood draw.  Patient also now telling RN that she is belching and noting relief of discomfort with belching and wanted to make MD aware.   Will notify Dr. Tomi Bamberger

## 2016-10-03 NOTE — ED Notes (Signed)
Patient able to ambulate independently  

## 2016-12-08 ENCOUNTER — Ambulatory Visit
Admission: RE | Admit: 2016-12-08 | Discharge: 2016-12-08 | Disposition: A | Discharge status: Home / Self Care | Payer: BLUE CROSS/BLUE SHIELD | Source: Ambulatory Visit | Attending: Gynecology | Admitting: Gynecology

## 2016-12-08 DIAGNOSIS — Z1231 Encounter for screening mammogram for malignant neoplasm of breast: Secondary | ICD-10-CM

## 2016-12-14 ENCOUNTER — Telehealth: Payer: Self-pay | Admitting: *Deleted

## 2016-12-14 MED ORDER — TRAMADOL HCL 50 MG PO TABS: 50.0000 mg | ORAL_TABLET | Freq: Three times a day (TID) | ORAL | 1 refills | Status: DC

## 2016-12-14 NOTE — Telephone Encounter (Signed)
Pt called stating Lysteda 650 mg is helping with bleeding, but the motrin 800 mg is no longer helping with cramping, pt said cramps can be very intense with cycle. States she has cramping off/on when not on cycle those are not bad. Pt wanted to know if any other options for pain medication for cramping during cycle? Please advise

## 2016-12-14 NOTE — Telephone Encounter (Signed)
Pt aware, Rx will be called in.

## 2016-12-14 NOTE — Telephone Encounter (Signed)
Can try tramadol 50 mg one by mouth every 8 hour when necessary pain #30. With one refill

## 2016-12-28 ENCOUNTER — Telehealth: Payer: Self-pay

## 2016-12-28 NOTE — Telephone Encounter (Signed)
Patient notified Rosemarie Ax yesterday that she is ready to schedule surgery for November. Please send order. THanks

## 2016-12-29 ENCOUNTER — Telehealth: Payer: Self-pay

## 2016-12-29 NOTE — Telephone Encounter (Signed)
I called patient to let her know that I had scheduled her surgery for 02/27/17 10:00am at [facility] at her request for a November date.  We discussed her ins benefits and her estimated prepymt and I have sent her a Ambulance person.  Per Dr. Loetta Rough- #1 I discussed need for bowel prep day before. I advised her to stay somewhere where she can go to restroom easily as needed.  I have mailed her bowel prep instructions. She will review and let me know if she has any questions.  #2-I advised patient to take an iron supplement daily.  #3- I inquired of patient if she is okay with a blood transfusion if it is needed. She has no objections to this and said she would be fine with it.

## 2017-01-17 ENCOUNTER — Telehealth: Payer: Self-pay | Admitting: *Deleted

## 2017-01-17 NOTE — Telephone Encounter (Signed)
I spoke with pharmacist and Rx will not be filled.

## 2017-01-17 NOTE — Telephone Encounter (Signed)
Pharmacist called regarding patient ultram 50 mg Rx. Rx was approved/ called in for ultram 50 mg 1 po q 8 hour #30 with 1 refill on 12/14/16. Pt filled #30 on 12/21/16, pharmacy said pt had a Rx filled from Dr. Ricke Hey for ultram 50 #60 twice daily on 01/02/17, which was filled at another pharmacy (see could see in computer system). Patient is now requesting the refill on previous Rx approved on 12/14/16. The pharmacy wanted you to be aware of this and asked if you want them to refill ultram?

## 2017-01-17 NOTE — Telephone Encounter (Signed)
No refill from this office

## 2017-02-02 ENCOUNTER — Telehealth: Payer: Self-pay

## 2017-02-02 NOTE — Telephone Encounter (Signed)
Patient called in voice mail and said her employer was wanting information about her surgery so they could plan her time out of work. I called her back and left message in voice mail explaining need for signed med rec release form on file prior to releasing any information. Also, asked her to check with employer to make sure there is not forms to be completed by me.

## 2017-02-16 ENCOUNTER — Encounter (HOSPITAL_COMMUNITY): Payer: Self-pay

## 2017-02-16 ENCOUNTER — Encounter (HOSPITAL_COMMUNITY)
Admission: RE | Admit: 2017-02-16 | Discharge: 2017-02-16 | Disposition: A | Discharge status: Home / Self Care | Payer: BLUE CROSS/BLUE SHIELD | Source: Ambulatory Visit | Attending: Gynecology | Admitting: Gynecology

## 2017-02-16 DIAGNOSIS — Z01812 Encounter for preprocedural laboratory examination: Secondary | ICD-10-CM | POA: Insufficient documentation

## 2017-02-16 HISTORY — DX: Unspecified injury of shoulder and upper arm, unspecified arm, initial encounter: S49.90XA

## 2017-02-16 HISTORY — DX: Anxiety disorder, unspecified: F41.9

## 2017-02-16 HISTORY — DX: Headache: R51

## 2017-02-16 HISTORY — DX: Headache, unspecified: R51.9

## 2017-02-16 LAB — COMPREHENSIVE METABOLIC PANEL
ALK PHOS: 72 U/L (ref 38–126)
ALT: 15 U/L (ref 14–54)
ANION GAP: 8 (ref 5–15)
AST: 16 U/L (ref 15–41)
Albumin: 4.2 g/dL (ref 3.5–5.0)
BILIRUBIN TOTAL: 0.6 mg/dL (ref 0.3–1.2)
BUN: 10 mg/dL (ref 6–20)
CALCIUM: 9.5 mg/dL (ref 8.9–10.3)
CO2: 24 mmol/L (ref 22–32)
Chloride: 107 mmol/L (ref 101–111)
Creatinine, Ser: 0.78 mg/dL (ref 0.44–1.00)
GFR calc Af Amer: >60 mL/min (ref >60)
Glucose, Bld: 84 mg/dL (ref 65–99)
POTASSIUM: 3.7 mmol/L (ref 3.5–5.1)
Sodium: 139 mmol/L (ref 135–145)
TOTAL PROTEIN: 7.8 g/dL (ref 6.5–8.1)

## 2017-02-16 LAB — CBC
HCT: 36.7 % (ref 36.0–46.0)
HEMOGLOBIN: 13.2 g/dL (ref 12.0–15.0)
MCH: 27.4 pg (ref 26.0–34.0)
MCHC: 36 g/dL (ref 30.0–36.0)
MCV: 76.3 fL — AB (ref 78.0–100.0)
Platelets: 442 10*3/uL — ABNORMAL HIGH (ref 150–400)
RBC: 4.81 MIL/uL (ref 3.87–5.11)
RDW: 15.2 % (ref 11.5–15.5)
WBC: 9.2 10*3/uL (ref 4.0–10.5)

## 2017-02-16 NOTE — Patient Instructions (Signed)
Your procedure is scheduled on:  Monday, Nov. 5, 2018  Enter through the [employer] [facility] at:  8:00 AM  Pick up the phone at the desk and dial [phone].  Call this number if you have problems the morning of surgery: [phone].  Remember: Do NOT eat food or drink after:  Midnight Sunday  Take these medicines the morning of surgery with a SIP OF WATER:  Xanax if needed  Bring Asthma Inhaler day of surgery  Stop ALL herbal medications at this time  Do NOT smoke the day of surgery.  Do NOT wear jewelry (body piercing), metal hair clips/bobby pins, make-up, artifical eyelashes or nail polish. Do NOT wear lotions, powders, or perfumes.  You may wear deodorant. Do NOT shave for 48 hours prior to surgery. Do NOT bring valuables to the hospital. Contacts, dentures, or bridgework may not be worn into surgery.  Leave suitcase in car.  After surgery it may be brought to your room.  For patients admitted to the hospital, checkout time is 11:00 AM the day of discharge.  Bring a copy of your healthcare power of attorney and living will documents.

## 2017-02-20 ENCOUNTER — Encounter: Payer: Self-pay | Admitting: Gynecology

## 2017-02-20 ENCOUNTER — Ambulatory Visit (INDEPENDENT_AMBULATORY_CARE_PROVIDER_SITE_OTHER): Payer: BLUE CROSS/BLUE SHIELD | Admitting: Gynecology

## 2017-02-20 VITALS — BP 122/78

## 2017-02-20 DIAGNOSIS — N946 Dysmenorrhea, unspecified: Secondary | ICD-10-CM

## 2017-02-20 DIAGNOSIS — N924 Excessive bleeding in the premenopausal period: Secondary | ICD-10-CM | POA: Diagnosis not present

## 2017-02-20 DIAGNOSIS — D259 Leiomyoma of uterus, unspecified: Secondary | ICD-10-CM | POA: Diagnosis not present
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
## 2017-02-20 NOTE — Patient Instructions (Signed)
Follow-up for surgery as scheduled.  Call sooner if you have any questions or issues.
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
## 2017-02-23 MED FILL — IBUPROFEN 800 MG TABS: 800 | 30 days supply | Qty: 90 | Fill #0

## 2017-02-27 ENCOUNTER — Inpatient Hospital Stay (HOSPITAL_COMMUNITY)
Admission: AD | Admit: 2017-02-27 | Discharge: 2017-02-28 | DRG: 743 | Disposition: A | Discharge status: Home / Self Care | Payer: BLUE CROSS/BLUE SHIELD | Source: Ambulatory Visit | Attending: Gynecology | Admitting: Gynecology

## 2017-02-27 ENCOUNTER — Ambulatory Visit (HOSPITAL_COMMUNITY): Payer: BLUE CROSS/BLUE SHIELD | Admitting: Anesthesiology

## 2017-02-27 ENCOUNTER — Encounter (HOSPITAL_COMMUNITY): Payer: Self-pay

## 2017-02-27 ENCOUNTER — Encounter (HOSPITAL_COMMUNITY)
Admission: AD | Disposition: A | Discharge status: Home / Self Care | Payer: Self-pay | Source: Ambulatory Visit | Attending: Gynecology

## 2017-02-27 DIAGNOSIS — D259 Leiomyoma of uterus, unspecified: Principal | ICD-10-CM | POA: Diagnosis present

## 2017-02-27 DIAGNOSIS — N946 Dysmenorrhea, unspecified: Secondary | ICD-10-CM | POA: Diagnosis present

## 2017-02-27 DIAGNOSIS — N920 Excessive and frequent menstruation with regular cycle: Secondary | ICD-10-CM | POA: Diagnosis present

## 2017-02-27 DIAGNOSIS — N838 Other noninflammatory disorders of ovary, fallopian tube and broad ligament: Secondary | ICD-10-CM | POA: Diagnosis present

## 2017-02-27 DIAGNOSIS — D219 Benign neoplasm of connective and other soft tissue, unspecified: Secondary | ICD-10-CM | POA: Diagnosis present

## 2017-02-27 DIAGNOSIS — R102 Pelvic and perineal pain: Secondary | ICD-10-CM

## 2017-02-27 HISTORY — PX: HYSTERECTOMY ABDOMINAL WITH SALPINGECTOMY: SHX6725

## 2017-02-27 LAB — HCG, SERUM, QUALITATIVE: PREG SERUM: NEGATIVE

## 2017-02-27 SURGERY — HYSTERECTOMY, TOTAL, ABDOMINAL, WITH SALPINGECTOMY
Anesthesia: General | Laterality: Bilateral

## 2017-02-27 MED ORDER — DIPHENHYDRAMINE HCL 50 MG/ML IJ SOLN: 12.5000 mg | Freq: Four times a day (QID) | INTRAMUSCULAR | Status: DC | PRN

## 2017-02-27 MED ORDER — DEXAMETHASONE SODIUM PHOSPHATE 4 MG/ML IJ SOLN
INTRAMUSCULAR | Status: AC
  Filled 2017-02-27: qty 1

## 2017-02-27 MED ORDER — DIPHENHYDRAMINE HCL 12.5 MG/5ML PO ELIX: 12.5000 mg | ORAL_SOLUTION | Freq: Four times a day (QID) | ORAL | Status: DC | PRN

## 2017-02-27 MED ORDER — FENTANYL CITRATE (PF) 100 MCG/2ML IJ SOLN
50.0000 ug | Freq: Once | INTRAMUSCULAR | Status: AC
  Administered 2017-02-27: 50 ug via INTRAVENOUS

## 2017-02-27 MED ORDER — SCOPOLAMINE 1 MG/3DAYS TD PT72
1.0000 | MEDICATED_PATCH | Freq: Once | TRANSDERMAL | Status: DC
  Administered 2017-02-27: 1.5 mg via TRANSDERMAL

## 2017-02-27 MED ORDER — LACTATED RINGERS IV SOLN
INTRAVENOUS | Status: DC
  Administered 2017-02-27 (×2): via INTRAVENOUS

## 2017-02-27 MED ORDER — PROPOFOL 10 MG/ML IV BOLUS
INTRAVENOUS | Status: AC
  Filled 2017-02-27: qty 20

## 2017-02-27 MED ORDER — ROCURONIUM BROMIDE 100 MG/10ML IV SOLN
INTRAVENOUS | Status: AC
  Filled 2017-02-27: qty 1

## 2017-02-27 MED ORDER — PROPOFOL 10 MG/ML IV BOLUS
INTRAVENOUS | Status: DC | PRN
  Administered 2017-02-27: 200 mg via INTRAVENOUS

## 2017-02-27 MED ORDER — SUGAMMADEX SODIUM 200 MG/2ML IV SOLN
INTRAVENOUS | Status: DC | PRN
  Administered 2017-02-27: 200 mg via INTRAVENOUS

## 2017-02-27 MED ORDER — CEFOTETAN DISODIUM-DEXTROSE 2-2.08 GM-%(50ML) IV SOLR
2.0000 g | INTRAVENOUS | Status: AC
  Administered 2017-02-27: 2 g via INTRAVENOUS

## 2017-02-27 MED ORDER — SODIUM CHLORIDE 0.9 % IJ SOLN
INTRAMUSCULAR | Status: DC | PRN
  Administered 2017-02-27: 30 mL

## 2017-02-27 MED ORDER — ALPRAZOLAM 0.5 MG PO TABS
0.5000 mg | ORAL_TABLET | Freq: Two times a day (BID) | ORAL | Status: DC | PRN
  Administered 2017-02-27 – 2017-02-28 (×2): 0.5 mg via ORAL
  Filled 2017-02-27 (×2): qty 1

## 2017-02-27 MED ORDER — DEXAMETHASONE SODIUM PHOSPHATE 10 MG/ML IJ SOLN
INTRAMUSCULAR | Status: DC | PRN
  Administered 2017-02-27: 4 mg via INTRAVENOUS

## 2017-02-27 MED ORDER — NALOXONE HCL 0.4 MG/ML IJ SOLN: 0.4000 mg | INTRAMUSCULAR | Status: DC | PRN

## 2017-02-27 MED ORDER — MORPHINE SULFATE 2 MG/ML IV SOLN
INTRAVENOUS | Status: DC
  Administered 2017-02-27: 11 mg via INTRAVENOUS
  Administered 2017-02-27: 16:00:00 via INTRAVENOUS
  Administered 2017-02-28: 10 mg via INTRAVENOUS
  Administered 2017-02-28: 9 mg via INTRAVENOUS
  Filled 2017-02-27: qty 30

## 2017-02-27 MED ORDER — ONDANSETRON HCL 4 MG/2ML IJ SOLN
INTRAMUSCULAR | Status: DC | PRN
  Administered 2017-02-27: 4 mg via INTRAVENOUS

## 2017-02-27 MED ORDER — ACETAMINOPHEN 10 MG/ML IV SOLN
1000.0000 mg | Freq: Once | INTRAVENOUS | Status: AC
  Administered 2017-02-27: 1000 mg via INTRAVENOUS

## 2017-02-27 MED ORDER — MIDAZOLAM HCL 2 MG/2ML IJ SOLN
INTRAMUSCULAR | Status: AC
  Filled 2017-02-27: qty 2

## 2017-02-27 MED ORDER — CEFOTETAN DISODIUM-DEXTROSE 2-2.08 GM-%(50ML) IV SOLR
INTRAVENOUS | Status: AC
  Filled 2017-02-27: qty 50

## 2017-02-27 MED ORDER — HYDROMORPHONE HCL 1 MG/ML IJ SOLN
INTRAMUSCULAR | Status: AC
  Filled 2017-02-27: qty 1

## 2017-02-27 MED ORDER — SODIUM CHLORIDE 0.9 % IJ SOLN
INTRAMUSCULAR | Status: AC
  Filled 2017-02-27: qty 20

## 2017-02-27 MED ORDER — ALBUTEROL SULFATE (2.5 MG/3ML) 0.083% IN NEBU: 2.5000 mg | INHALATION_SOLUTION | Freq: Four times a day (QID) | RESPIRATORY_TRACT | Status: DC | PRN

## 2017-02-27 MED ORDER — MEPERIDINE HCL 25 MG/ML IJ SOLN: 6.2500 mg | INTRAMUSCULAR | Status: DC | PRN

## 2017-02-27 MED ORDER — KETOROLAC TROMETHAMINE 30 MG/ML IJ SOLN
30.0000 mg | Freq: Four times a day (QID) | INTRAMUSCULAR | Status: DC
Start: 2017-02-27 — End: 2017-02-28

## 2017-02-27 MED ORDER — LIDOCAINE HCL (CARDIAC) 20 MG/ML IV SOLN
INTRAVENOUS | Status: AC
  Filled 2017-02-27: qty 5

## 2017-02-27 MED ORDER — SODIUM CHLORIDE 0.9 % IJ SOLN
INTRAMUSCULAR | Status: AC
  Filled 2017-02-27: qty 10

## 2017-02-27 MED ORDER — LIDOCAINE HCL (CARDIAC) 20 MG/ML IV SOLN
INTRAVENOUS | Status: DC | PRN
  Administered 2017-02-27: 60 mg via INTRAVENOUS

## 2017-02-27 MED ORDER — SCOPOLAMINE 1 MG/3DAYS TD PT72
MEDICATED_PATCH | TRANSDERMAL | Status: AC
  Administered 2017-02-27: 1.5 mg via TRANSDERMAL
  Filled 2017-02-27: qty 1

## 2017-02-27 MED ORDER — KETOROLAC TROMETHAMINE 30 MG/ML IJ SOLN: 30.0000 mg | Freq: Once | INTRAMUSCULAR | Status: DC | PRN

## 2017-02-27 MED ORDER — BUPIVACAINE LIPOSOME 1.3 % IJ SUSP
20.0000 mL | Freq: Once | INTRAMUSCULAR | Status: AC
  Administered 2017-02-27: 20 mL
  Filled 2017-02-27: qty 20

## 2017-02-27 MED ORDER — DEXTROSE-NACL 5-0.9 % IV SOLN
INTRAVENOUS | Status: DC
  Administered 2017-02-27 (×2): via INTRAVENOUS

## 2017-02-27 MED ORDER — KETOROLAC TROMETHAMINE 30 MG/ML IJ SOLN
INTRAMUSCULAR | Status: DC | PRN
  Administered 2017-02-27: 30 mg via INTRAVENOUS

## 2017-02-27 MED ORDER — PHENYLEPHRINE HCL 10 MG/ML IJ SOLN
INTRAMUSCULAR | Status: DC | PRN
  Administered 2017-02-27 (×4): 80 ug via INTRAVENOUS

## 2017-02-27 MED ORDER — GABAPENTIN 300 MG PO CAPS
600.0000 mg | ORAL_CAPSULE | Freq: Once | ORAL | Status: AC
  Administered 2017-02-27: 600 mg via ORAL
  Filled 2017-02-27: qty 2

## 2017-02-27 MED ORDER — FENTANYL CITRATE (PF) 100 MCG/2ML IJ SOLN
INTRAMUSCULAR | Status: DC | PRN
  Administered 2017-02-27 (×2): 50 ug via INTRAVENOUS
  Administered 2017-02-27: 100 ug via INTRAVENOUS
  Administered 2017-02-27: 50 ug via INTRAVENOUS

## 2017-02-27 MED ORDER — PROMETHAZINE HCL 25 MG/ML IJ SOLN: 6.2500 mg | INTRAMUSCULAR | Status: DC | PRN

## 2017-02-27 MED ORDER — ONDANSETRON HCL 4 MG/2ML IJ SOLN
INTRAMUSCULAR | Status: AC
  Filled 2017-02-27: qty 2

## 2017-02-27 MED ORDER — FENTANYL CITRATE (PF) 100 MCG/2ML IJ SOLN
INTRAMUSCULAR | Status: AC
  Filled 2017-02-27: qty 2

## 2017-02-27 MED ORDER — SUGAMMADEX SODIUM 200 MG/2ML IV SOLN
INTRAVENOUS | Status: AC
  Filled 2017-02-27: qty 2

## 2017-02-27 MED ORDER — HYDROMORPHONE HCL 1 MG/ML IJ SOLN
0.2500 mg | INTRAMUSCULAR | Status: DC | PRN
  Administered 2017-02-27 (×5): 0.5 mg via INTRAVENOUS

## 2017-02-27 MED ORDER — SODIUM CHLORIDE 0.9% FLUSH: 9.0000 mL | INTRAVENOUS | Status: DC | PRN

## 2017-02-27 MED ORDER — KETOROLAC TROMETHAMINE 30 MG/ML IJ SOLN
INTRAMUSCULAR | Status: AC
  Filled 2017-02-27: qty 1

## 2017-02-27 MED ORDER — ACETAMINOPHEN 10 MG/ML IV SOLN
INTRAVENOUS | Status: AC
  Filled 2017-02-27: qty 100

## 2017-02-27 MED ORDER — ONDANSETRON HCL 4 MG/2ML IJ SOLN
4.0000 mg | Freq: Four times a day (QID) | INTRAMUSCULAR | Status: DC | PRN
  Administered 2017-02-27: 4 mg via INTRAVENOUS
  Filled 2017-02-27: qty 2

## 2017-02-27 MED ORDER — MIDAZOLAM HCL 2 MG/2ML IJ SOLN
INTRAMUSCULAR | Status: DC | PRN
  Administered 2017-02-27: 2 mg via INTRAVENOUS

## 2017-02-27 MED ORDER — KETOROLAC TROMETHAMINE 30 MG/ML IJ SOLN
30.0000 mg | Freq: Four times a day (QID) | INTRAMUSCULAR | Status: DC
Start: 2017-02-27 — End: 2017-02-28
  Administered 2017-02-27 – 2017-02-28 (×3): 30 mg via INTRAVENOUS
  Filled 2017-02-27 (×3): qty 1

## 2017-02-27 MED ORDER — FENTANYL CITRATE (PF) 250 MCG/5ML IJ SOLN
INTRAMUSCULAR | Status: AC
  Filled 2017-02-27: qty 5

## 2017-02-27 MED ORDER — HYDROMORPHONE HCL 1 MG/ML IJ SOLN
INTRAMUSCULAR | Status: DC | PRN
  Administered 2017-02-27: 1 mg via INTRAVENOUS

## 2017-02-27 MED ORDER — ROCURONIUM BROMIDE 100 MG/10ML IV SOLN
INTRAVENOUS | Status: DC | PRN
  Administered 2017-02-27: 10 mg via INTRAVENOUS
  Administered 2017-02-27: 40 mg via INTRAVENOUS

## 2017-02-27 SURGICAL SUPPLY — 40 items
APL SKNCLS STERI-STRIP NONHPOA (GAUZE/BANDAGES/DRESSINGS)
BARRIER ADHS 3X4 INTERCEED (GAUZE/BANDAGES/DRESSINGS) IMPLANT
BENZOIN TINCTURE PRP APPL 2/3 (GAUZE/BANDAGES/DRESSINGS) IMPLANT
BRR ADH 4X3 ABS CNTRL BYND (GAUZE/BANDAGES/DRESSINGS)
CANISTER SUCT 3000ML PPV (MISCELLANEOUS) ×2 IMPLANT
CLOTH BEACON ORANGE TIMEOUT ST (SAFETY) ×2 IMPLANT
CONT PATH 16OZ SNAP LID 3702 (MISCELLANEOUS) ×2 IMPLANT
DECANTER SPIKE VIAL GLASS SM (MISCELLANEOUS) IMPLANT
DRAPE CESAREAN BIRTH W POUCH (DRAPES) ×2 IMPLANT
DRAPE WARM FLUID 44X44 (DRAPE) IMPLANT
DRSG OPSITE POSTOP 4X10 (GAUZE/BANDAGES/DRESSINGS) ×2 IMPLANT
DURAPREP 26ML APPLICATOR (WOUND CARE) ×2 IMPLANT
GAUZE SPONGE 4X4 16PLY XRAY LF (GAUZE/BANDAGES/DRESSINGS) IMPLANT
GLOVE BIO SURGEON STRL SZ7.5 (GLOVE) ×2 IMPLANT
GLOVE BIOGEL PI IND STRL 7.0 (GLOVE) ×2 IMPLANT
GLOVE BIOGEL PI IND STRL 8 (GLOVE) ×1 IMPLANT
GLOVE BIOGEL PI INDICATOR 7.0 (GLOVE) ×2
GLOVE BIOGEL PI INDICATOR 8 (GLOVE) ×1
GLOVE ECLIPSE 7.5 STRL STRAW (GLOVE) ×2 IMPLANT
GOWN STRL REUS W/TWL LRG LVL3 (GOWN DISPOSABLE) ×6 IMPLANT
HEMOSTAT ARISTA ABSORB 3G PWDR (MISCELLANEOUS) IMPLANT
NEEDLE HYPO 22GX1.5 SAFETY (NEEDLE) ×2 IMPLANT
NS IRRIG 1000ML POUR BTL (IV SOLUTION) ×2 IMPLANT
PACK ABDOMINAL GYN (CUSTOM PROCEDURE TRAY) ×2 IMPLANT
PAD OB MATERNITY 4.3X12.25 (PERSONAL CARE ITEMS) ×2 IMPLANT
PROTECTOR NERVE ULNAR (MISCELLANEOUS) ×2 IMPLANT
RETAINER VISCERAL (MISCELLANEOUS) IMPLANT
SPONGE LAP 18X18 X RAY DECT (DISPOSABLE) ×4 IMPLANT
STRIP CLOSURE SKIN 1/2X4 (GAUZE/BANDAGES/DRESSINGS) IMPLANT
SUT VIC AB 0 CT1 18XCR BRD8 (SUTURE) ×3 IMPLANT
SUT VIC AB 0 CT1 27 (SUTURE) ×6
SUT VIC AB 0 CT1 27XBRD ANBCTR (SUTURE) ×3 IMPLANT
SUT VIC AB 0 CT1 8-18 (SUTURE) ×6
SUT VIC AB 2-0 SH 27 (SUTURE)
SUT VIC AB 2-0 SH 27XBRD (SUTURE) IMPLANT
SUT VIC AB 4-0 KS 27 (SUTURE) ×2 IMPLANT
SUT VICRYL 0 TIES 12 18 (SUTURE) ×2 IMPLANT
SYR CONTROL 10ML LL (SYRINGE) ×2 IMPLANT
TOWEL OR 17X24 6PK STRL BLUE (TOWEL DISPOSABLE) ×4 IMPLANT
TRAY FOLEY CATH SILVER 14FR (SET/KITS/TRAYS/PACK) ×2 IMPLANT

## 2017-02-27 NOTE — Transfer of Care (Signed)
Immediate Anesthesia Transfer of Care Note  Patient: Debra Hicks  Procedure(s) Performed: HYSTERECTOMY ABDOMINAL WITH SALPINGECTOMY (Bilateral )  Patient Location: PACU  Anesthesia Type:General  Level of Consciousness: awake, alert  and oriented  Airway & Oxygen Therapy: Patient Spontanous Breathing and Patient connected to nasal cannula oxygen  Post-op Assessment: Report given to RN and Post -op Vital signs reviewed and stable  Post vital signs: Reviewed and stable  Last Vitals:  Vitals:   02/27/17 0836  BP: 134/90  Pulse: 88  Resp: 16  Temp: 37.2 C  SpO2: 99%    Last Pain:  Vitals:   02/27/17 0836  TempSrc: Oral      Patients Stated Pain Goal: 3 (62/95/28 4132)  Complications: No apparent anesthesia complications

## 2017-02-27 NOTE — Progress Notes (Signed)
Patient ID: Debra Hicks, female   DOB: December 09, 1970, 46 y.o.   MRN: [id number]  In to see patient Awake, Alert with good pain relief from PCA Dressing dry intact BP's 170's/80-90's Results of surgery reviewed with the patient.  Continue routine PO care

## 2017-02-27 NOTE — Anesthesia Preprocedure Evaluation (Signed)
Anesthesia Evaluation  Patient identified by MRN, date of birth, ID band Patient awake    Reviewed: Allergy & Precautions, H&P , NPO status , Patient's Chart, lab work & pertinent test results, reviewed documented beta blocker date and time   Airway Mallampati: I  TM Distance: >3 FB Neck ROM: full    Dental no notable dental hx. (+) Teeth Intact   Pulmonary neg pulmonary ROS,    Pulmonary exam normal breath sounds clear to auscultation       Cardiovascular negative cardio ROS Normal cardiovascular exam Rhythm:regular Rate:Normal     Neuro/Psych    GI/Hepatic negative GI ROS, Neg liver ROS,   Endo/Other  negative endocrine ROS  Renal/GU negative Renal ROS     Musculoskeletal   Abdominal Normal abdominal exam  (+)   Peds  Hematology   Anesthesia Other Findings   Reproductive/Obstetrics negative OB ROS                             Anesthesia Physical  Anesthesia Plan  ASA: II  Anesthesia Plan: General   Post-op Pain Management:    Induction: Intravenous  PONV Risk Score and Plan: 4 or greater and Ondansetron, Midazolam and Scopolamine patch - Pre-op  Airway Management Planned: Oral ETT  Additional Equipment:   Intra-op Plan:   Post-operative Plan: Extubation in OR  Informed Consent: I have reviewed the patients History and Physical, chart, labs and discussed the procedure including the risks, benefits and alternatives for the proposed anesthesia with the patient or authorized representative who has indicated his/her understanding and acceptance.   Dental Advisory Given and History available from chart only  Plan Discussed with: CRNA and Surgeon  Anesthesia Plan Comments:         Anesthesia Quick Evaluation

## 2017-02-27 NOTE — Progress Notes (Signed)
Dr. Phineas Real notified in regards to BPs 177/92, 173/89, 176/86. No new orders given. Toya Smothers, RN

## 2017-02-27 NOTE — H&P (Signed)
The patient was examined.  I reviewed the proposed surgery and consent form with the patient.  The dictated history and physical is current and accurate and all questions were answered. The patient is ready to proceed with surgery and has a realistic understanding and expectation for the outcome.   Anastasio Auerbach MD, 9:47 AM 02/27/2017

## 2017-02-27 NOTE — Addendum Note (Signed)
Addendum  created 02/27/17 2234 by Flossie Dibble, CRNA   Sign clinical note

## 2017-02-27 NOTE — Anesthesia Postprocedure Evaluation (Signed)
Anesthesia Post Note  Patient: Debra Hicks  Procedure(s) Performed: HYSTERECTOMY ABDOMINAL WITH SALPINGECTOMY (Bilateral )     Patient location during evaluation: [facility] Anesthesia Type: General Level of consciousness: awake and alert and oriented Pain management: satisfactory to patient Vital Signs Assessment: post-procedure vital signs reviewed and stable Respiratory status: spontaneous breathing and respiratory function stable Cardiovascular status: stable Postop Assessment: adequate PO intake Anesthetic complications: no    Last Vitals:  Vitals:   02/27/17 2045 02/27/17 2214  BP: (!) 147/91   Pulse: 61   Resp: 18 18  Temp:    SpO2:  98%    Last Pain:  Vitals:   02/27/17 2214  TempSrc:   PainSc: 4    Pain Goal: Patients Stated Pain Goal: 4 (02/27/17 1620)               Xitlalic Maslin

## 2017-02-27 NOTE — Op Note (Signed)
Debra Hicks 04/25/1971 [id number]   Post Operative Note   Date of surgery:  02/27/2017  Pre Op Dx: Leiomyoma, menorrhagia, dysmenorrhea  Post Op Dx: Leiomyoma, menorrhagia, dysmenorrhea  Procedure: Total abdominal hysterectomy, bilateral salpingectomies, lysis of adhesions  Surgeon:  Anastasio Auerbach  Assistant: Thomes Cake  Anesthesia:  General  EBL: 100 cc anesthesia reported  Complications:  None  Specimen: Uterus, cervix, right and left fallopian tubes, small separate myoma to pathology.  Clinical weight 542 g  Findings: Anterior cul-de-sac normal.  Posterior cul-de-sac normal.  Uterus grossly enlarged with multiple leiomyoma.  Several areas of epiploica adhesions to the serosa of the uterus, sharply lysed without difficulty.  Right and left ovaries grossly normal, free and mobile.  Right and left fallopian tubes grossly normal length, caliber and fimbriated ends.  No evidence of pelvic endometriosis.  Upper abdominal digital exam was grossly normal.  Liver was smooth but with no palpable abnormalities.  Appendix not visualized.  Procedure: The patient was taken to the operating room, placed in the supine position, underwent general anesthesia and received an abdominal, perineal/vaginal preparation per nursing personnel and a Foley catheter was placed in sterile technique.  The timeout was performed by the surgical team.  The patient was draped in the usual fashion.  The abdomen was sharply entered through a Pfannenstiel incision achieving adequate hemostasis at all levels.  The uterus was elevated through the incision and several areas of adhesions involving the epiploica of the bowel and the uterine serosa were lysed to totally free the uterus.  A Balfour retractor and bladder blade were then placed within the incision and the intestines were packed from the operative field.  The right round ligament was transected with electrocautery and the anterior vesicouterine  peritoneal fold was sharply and bluntly developed without difficulty.  The mesosalpinx was transected to free up the fallopian tube and the right uterine ovarian pedicle was doubly clamped cut and ligated using 0 Vicryl suture.  The right fallopian tube remaining with the uterine specimen.  The parametrial vasculature was skeletonized and the uterine vessels were identified, clamped cut and ligated using 0 Vicryl suture in the lower uterine segment.  The left round ligament was identified and transected with electrocautery and the anterior vesicouterine peritoneal fold was incised to meet the incision from the other side and the anterior bladder flap was sharply and bluntly developed without difficulty.  The left uterine ovarian pedicle was then doubly clamped cut and ligated using 0 Vicryl suture.  The left fallopian tube was excised using electrocautery and sent with the specimen.  The parametrial vasculature was skeletonized and the uterine vessels clamped cut and ligated using 0 Vicryl suture in the lower uterine segment.  To facilitate visualization due to the bulk of the uterus a supracervical hysterectomy was then performed and the cervical stump was grasped with Coker clamps.  The paracervical tissues to include cardinal ligaments and uterosacral ligaments were progressively clamped cut and ligated using 0 Vicryl suture to free up the cervical stump progressively developing the bladder flap and ultimately the upper vagina was crossclamped and the cervix excised in total and sent with the specimen.  Right and left 0 Vicryl angle sutures were placed and tagged for future reference and the intervening vaginal mucosa was closed anterior to posterior using 0 Vicryl suture in figure-of-eight interrupted stitch.  The pelvis was then copiously irrigated showing adequate hemostasis and the remaining tagged sutures were cut and the bowel packing was removed as was the  Balfour and bladder blade retractors.  The fascia  was then reapproximated using 0 Vicryl suture in a running stitch starting at the angles and meeting in the middle.  The subcutaneous tissues were injected using Exparel, the incision irrigated with hemostasis achieved using electrocautery and the skin was then reapproximated using 4-0 Vicryl suture in a running subcuticular stitch with subsequent benzoin and Steri-Strips and sterile dressing applied.  Sponge needle and instrument counts were verified correct.  The specimen was identified and labeled correctly for pathology.  The patient received intraoperative Toradol.  The patient was awakened without difficulty and taken to recovery room in good condition having tolerated the procedure well.     Anastasio Auerbach MD, 12:34 PM 02/27/2017

## 2017-02-27 NOTE — Anesthesia Procedure Notes (Signed)
Procedure Name: Intubation Date/Time: 02/27/2017 10:48 AM Performed by: Jonna Munro, CRNA Pre-anesthesia Checklist: Patient identified, Emergency Drugs available, Suction available, Patient being monitored and Timeout performed Patient Re-evaluated:Patient Re-evaluated prior to induction Oxygen Delivery Method: Circle system utilized Preoxygenation: Pre-oxygenation with 100% oxygen Induction Type: IV induction Ventilation: Mask ventilation without difficulty Laryngoscope Size: Mac and 3 Grade View: Grade I Tube type: Oral Tube size: 7.0 mm Number of attempts: 1 Airway Equipment and Method: Stylet Placement Confirmation: ETT inserted through vocal cords under direct vision,  positive ETCO2 and breath sounds checked- equal and bilateral Secured at: 22 cm Tube secured with: Tape Dental Injury: Teeth and Oropharynx as per pre-operative assessment

## 2017-02-27 NOTE — Anesthesia Postprocedure Evaluation (Signed)
Anesthesia Post Note  Patient: Debra Hicks  Procedure(s) Performed: HYSTERECTOMY ABDOMINAL WITH SALPINGECTOMY (Bilateral )     Patient location during evaluation: PACU Anesthesia Type: General Level of consciousness: awake and sedated Pain management: pain level controlled Vital Signs Assessment: post-procedure vital signs reviewed and stable Respiratory status: spontaneous breathing Cardiovascular status: stable Postop Assessment: no apparent nausea or vomiting Anesthetic complications: no    Last Vitals:  Vitals:   02/27/17 1415 02/27/17 1430  BP: (!) 155/94 (!) 167/97  Pulse: 66 67  Resp: 13 15  Temp:  37 C  SpO2: 100% 100%    Last Pain:  Vitals:   02/27/17 1430  TempSrc:   PainSc: Asleep   Pain Goal: Patients Stated Pain Goal: 4 (02/27/17 1430)               Jessamine

## 2017-02-28 ENCOUNTER — Encounter (HOSPITAL_COMMUNITY): Payer: Self-pay | Admitting: Gynecology

## 2017-02-28 ENCOUNTER — Other Ambulatory Visit: Payer: Self-pay

## 2017-02-28 MED ORDER — OXYCODONE-ACETAMINOPHEN 5-325 MG PO TABS: 2.0000 | ORAL_TABLET | Freq: Four times a day (QID) | ORAL | Status: DC | PRN

## 2017-02-28 MED ORDER — OXYCODONE-ACETAMINOPHEN 5-325 MG PO TABS: 1.0000 | ORAL_TABLET | ORAL | 0 refills | Status: DC | PRN

## 2017-02-28 MED ORDER — OXYCODONE-ACETAMINOPHEN 5-325 MG PO TABS
2.0000 | ORAL_TABLET | ORAL | Status: DC | PRN
  Administered 2017-02-28: 2 via ORAL
  Filled 2017-02-28: qty 2

## 2017-02-28 NOTE — Progress Notes (Signed)
Pt discharged home with printed instructions. Pt verbalized an understanding. No concerns noted. Melvin Whiteford L Waylyn Tenbrink, RN 

## 2017-02-28 NOTE — Discharge Instructions (Signed)
°  Postoperative Instructions Hysterectomy ° °Dr. Amritpal Shropshire and the nursing staff have discussed postoperative instructions with you.  If you have any questions please ask them before you leave the hospital, or call Dr Byron Tipping’s office at [phone].   ° °We would like to emphasize the following instructions: ° ° °  Call the office to make your follow-up appointment as recommended by Dr Willian Donson (usually 2 weeks). ° °  You were given a prescription, or one was ordered for you at the pharmacy you designated.  Get that prescription filled and take the medication according to instructions. ° °  You may eat a regular diet, but slowly until you start having bowel movements. ° °  Drink plenty of water daily. ° °  Nothing in the vagina (intercourse, douching, objects of any kind) until released by Dr Kiely Cousar. ° °  No driving for two weeks.  Wait to be cleared by Dr Willard Madrigal at your first post op check.  Car rides (short) are ok after several days at home, as long as you are not having significant pain, but no traveling out of town. ° °  You may shower, but no baths.  Walking up and down stairs is ok.  No heavy lifting, prolonged standing, repeated bending or any “working out” until your first post op check. ° °  Rest frequently, listen to your body and do not push yourself and overdo it. ° °  Call if: ° °o Your pain medication does not seem strong enough. °o Worsening pain or abdominal bloating °o Persistent nausea or vomiting °o Difficulty with urination or bowel movements. °o Temperature of 101 degrees or higher. °o Bleeding heavier then staining (clots or period type flow). °o Incisions become red, tender or begin to drain. °o You have any questions or concerns. °

## 2017-02-28 NOTE — Progress Notes (Signed)
Patient ID: Debra Hicks, female   DOB: 1971-04-21, 46 y.o.   MRN: [id number] Debra Hicks 10-May-1970 [id number]   1 Day Post-Op Procedure(s) (LRB): HYSTERECTOMY ABDOMINAL WITH SALPINGECTOMY (Bilateral)  Subjective: Patient reports feels well, no acute distress, pain severity reported mild, Yes.   taking PO, foley catheter out, No. voiding, Yes.   ambulating, Yes.   passing flatus  Objective: Vital signs in last 24 hours: Temp:  [98.4 F (36.9 C)-99.4 F (37.4 C)] 98.4 F (36.9 C) (11/06 0415) Pulse Rate:  [54-88] 55 (11/06 0415) Resp:  [9-20] 16 (11/06 0556) BP: (120-189)/(65-125) 120/65 (11/06 0415) SpO2:  [98 %-100 %] 98 % (11/06 0556) Weight:  [155 lb 2 oz (70.4 kg)] 155 lb 2 oz (70.4 kg) (11/05 1654) Last BM Date: 02/26/17    EXAM General: awake, alert and no distress Resp: clear to auscultation bilaterally Cardio: regular rate and rhythm GI: soft, minimal tender, bowel sounds active, dressing dry Lower Extremities: Without swelling or tenderness Vaginal Bleeding: Reported scant   Assessment: s/p Procedure(s): HYSTERECTOMY ABDOMINAL WITH SALPINGECTOMY: progressing well, ready for discharge.    Plan: Discharge home later today after voiding and good pain relief with oral pain med.  Precautions, instructions and follow up were discussed with the patient.  Prescriptions provided per AVS.  Patient to call the office to arrange a post-operative appointmant in 2 weeks.    Anastasio Auerbach MD, 7:30 AM 02/28/2017

## 2017-03-01 ENCOUNTER — Telehealth: Payer: Self-pay | Admitting: *Deleted

## 2017-03-01 NOTE — Telephone Encounter (Signed)
Patient post abdominal-hysterectomy on 02/27/17 c/o lack of sleep. Pt said she will fall asleep for 1 hour and up the reminder of the night. Has Rx for Ambien wanted to confirm okay to take this post surgery. Pt states she was told to discontinue Rx. Please advise

## 2017-03-01 NOTE — Telephone Encounter (Signed)
I would be careful mixing the narcotic pain medication and the Ambien.  She may want to take the narcotic pain medication, 2 pills before bedtime and see if this does not help with her discomfort and allow her to sleep.  If not then she can try the Ambien but I would make sure that she is not sedated from the narcotics.

## 2017-03-01 NOTE — Discharge Summary (Signed)
Debra Hicks 09-27-1970 [id number]   Discharge Summary  Date of Admission:  02/27/2017  Date of Discharge:  02/28/2017  Discharge Diagnosis: Leiomyoma, dysmenorrhea, menorrhagia  Procedure:  Procedure(s): HYSTERECTOMY ABDOMINAL WITH SALPINGECTOMY  Pathology:  Uterus, cervix and bilateral fallopian tubes - UTERUS: -ENDOMETRIUM: SECRETORY ENDOMETRIUM. NO HYPERPLASIA OR MALIGNANCY. -MYOMETRIUM: LEIOMYOMATA. NO MALIGNANCY. -SEROSA: FIBROUS ADHESIONS. NO MALIGNANCY. - CERVIX: BENIGN SQUAMOUS AND ENDOCERVICAL MUCOSA. NO DYSPLASIA OR MALIGNANCY. - BILATERAL FALLOPIAN TUBES: PARATUBAL CYSTS. NO MALIGNANCY.  Hospital Course: The patient underwent an uncomplicated TAH bilateral salpingectomies 02/27/2017.  She was discharged the day following surgery ambulating, tolerating a regular diet, voiding without difficulty and having good pain relief on oral medications.  The patient received instructions for postoperative care and call precautions.  She received prescriptions per AVS and will be seen in the office 2 weeks following discharge.       Anastasio Auerbach MD, 10:09 AM 03/01/2017

## 2017-03-01 NOTE — Telephone Encounter (Signed)
Pt informed

## 2017-03-03 ENCOUNTER — Telehealth: Payer: Self-pay | Admitting: Obstetrics and Gynecology

## 2017-03-03 NOTE — Telephone Encounter (Signed)
After hours phone from [facility] [phone] regarding prescription for Percocet.   Pharmacy stating patient received rx for Tramadol in October.  Also has Ambien and Xanax on her medication list.   I explained that the patient is post op and would require narcotic to control pain as prescribed by her gynecologist.   I noted a phone note from patient's GYN office in [place] regarding caution to the patient regarding the use of Ambien and narcotic.

## 2017-03-09 ENCOUNTER — Telehealth: Payer: Self-pay

## 2017-03-09 NOTE — Telephone Encounter (Signed)
Today [employer] handling disability claim called wanting information. I had mailed patient a med rec release form previously and it was returned mail to me. I resent it. I called patient today to be sure she received it and she did. I reminded her that it needs to give me permission to release information to [employer]. I told her I will tell [employer] that I cannot provide them with any information until I have the release form. Patient was fine with that.

## 2017-03-13 ENCOUNTER — Telehealth: Payer: Self-pay | Admitting: *Deleted

## 2017-03-13 NOTE — Telephone Encounter (Signed)
Pt called post op patient c/o sharp pain off and on in abdomen, lower back discomfort as well off and on, no fever, no urinary symptoms, having normal bowel movements. Taking Motrin 800 mg as needed for discomfort. Pt has post op appointment scheduled on 03/14/17 told pt to continue Motrin and rest and follow up with OV tomorrow.

## 2017-03-14 ENCOUNTER — Encounter: Payer: Self-pay | Admitting: Gynecology

## 2017-03-14 ENCOUNTER — Ambulatory Visit (INDEPENDENT_AMBULATORY_CARE_PROVIDER_SITE_OTHER): Payer: BLUE CROSS/BLUE SHIELD | Admitting: Gynecology

## 2017-03-14 VITALS — BP 130/82

## 2017-03-14 DIAGNOSIS — R102 Pelvic and perineal pain: Secondary | ICD-10-CM

## 2017-03-14 DIAGNOSIS — Z09 Encounter for follow-up examination after completed treatment for conditions other than malignant neoplasm: Secondary | ICD-10-CM

## 2017-03-14 MED ORDER — OXYCODONE-ACETAMINOPHEN 5-325 MG PO TABS: 1.0000 | ORAL_TABLET | ORAL | 0 refills | Status: DC | PRN

## 2017-03-14 NOTE — Addendum Note (Signed)
Addended by: Nelva Nay on: 03/14/2017 12:39 PM   Modules accepted: Orders

## 2017-03-14 NOTE — Patient Instructions (Signed)
Follow-up in 2 weeks for your next postoperative visit. 

## 2017-03-14 NOTE — Progress Notes (Signed)
    Debra Hicks October 19, 1970 [id number]        46 y.o.  G2P0011 presents for her first 2-week postoperative visit status post TAH bilateral salpingectomies.  Has done well.  Is still having a fair amount of discomfort with movement.  Eating, voiding and having bowel movements without difficulty.  Has run out of her narcotic pain medication.  Does have ibuprofen 800 mg at home but is not taking it consistently.  Final pathology showed leiomyoma.  Past medical history,surgical history, problem list, medications, allergies, family history and social history were all reviewed and documented in the EPIC chart.  Directed ROS with pertinent positives and negatives documented in the history of present illness/assessment and plan.  Exam: Caryn Bee assistant Vitals:   03/14/17 1145  BP: 130/82   General appearance:  Normal Abdomen soft with minimal tenderness.  Pfannenstiel incision healing nicely.  Steri-Strips removed. Pelvic external BUS vagina with cuff healing nicely.  Bimanual without masses or tenderness  Assessment/Plan:  46 y.o. G2P0011 with normal postoperative visit.  Patient having some pain although is not taking pain medication consistently.  Recommended ibuprofen 800 mg every 8 hours for the next week and then taper from there.  Oxycodone 5/325 #15 provided to have as backup with no refills.  Will slowly resume activities with the exception of pelvic rest and strenuous activities.  She asked about travel out of the state and disclaimers as far as complications and being away from the local area as well as long car rides discussed.  Patient will follow-up in 2 weeks for her next postoperative visit, sooner if any issues.  Reviewed findings of the surgery and final pathology with her.    Anastasio Auerbach MD, 12:06 PM 03/14/2017

## 2017-03-16 LAB — URINALYSIS W MICROSCOPIC + REFLEX CULTURE
BACTERIA UA: NONE SEEN /HPF
BILIRUBIN URINE: NEGATIVE
Glucose, UA: NEGATIVE
HGB URINE DIPSTICK: NEGATIVE
Hyaline Cast: NONE SEEN /LPF
Ketones, ur: NEGATIVE
NITRITES URINE, INITIAL: NEGATIVE
PH: 7 (ref 5.0–8.0)
Protein, ur: NEGATIVE
RBC / HPF: NONE SEEN /HPF (ref 0–2)
Specific Gravity, Urine: 1.012 (ref 1.001–1.03)

## 2017-03-16 LAB — URINE CULTURE
MICRO NUMBER:: 81314334
RESULT: NO GROWTH
SPECIMEN QUALITY:: ADEQUATE

## 2017-03-16 LAB — CULTURE INDICATED

## 2017-03-27 ENCOUNTER — Telehealth: Payer: Self-pay | Admitting: *Deleted

## 2017-03-27 MED FILL — IBUPROFEN 800 MG TABS: 800 | 30 days supply | Qty: 90 | Fill #1

## 2017-03-27 NOTE — Telephone Encounter (Signed)
Patient called c/o constipation asked what to take I told pt to increase fluids, can take OTC colace and metamucil, patient said she is no longer taking pain medication (oxycodone) only taking motrin as needed.

## 2017-03-31 ENCOUNTER — Ambulatory Visit (INDEPENDENT_AMBULATORY_CARE_PROVIDER_SITE_OTHER): Payer: BLUE CROSS/BLUE SHIELD | Admitting: Gynecology

## 2017-03-31 ENCOUNTER — Encounter: Payer: Self-pay | Admitting: Gynecology

## 2017-03-31 VITALS — BP 118/76

## 2017-03-31 DIAGNOSIS — Z9889 Other specified postprocedural states: Secondary | ICD-10-CM

## 2017-03-31 NOTE — Patient Instructions (Signed)
Follow-up in March 2019 for annual exam when you are due.

## 2017-03-31 NOTE — Progress Notes (Signed)
    Debra Hicks 15-Oct-1970 [id number]        46 y.o.  G2P0011 presents for her postoperative visit status post TAH bilateral salpingectomies 4 weeks ago.  Doing well although notes a little bit of depression and wondered if that was normal.  Past medical history,surgical history, problem list, medications, allergies, family history and social history were all reviewed and documented in the EPIC chart.  Directed ROS with pertinent positives and negatives documented in the history of present illness/assessment and plan.  Exam: Caryn Bee assistant Vitals:   03/31/17 1147  BP: 118/76   General appearance:  Normal Abdomen soft nontender without masses guarding rebound.  Incisions healed nicely. Pelvic external BUS vagina with cuff healed nicely.  Bimanual without masses or tenderness  Assessment/Plan:  46 y.o. G2P0011 with normal postop check.  Patient will slowly resume normal activities.  We will continue pelvic rest for another 4 weeks.  Assuming she continues well then she will see me in March when she is due for her annual exam, sooner if any issues.  I did review with her is not unusual to feel little depressed after hysterectomy for variety of reasons and I suspect that this will resolve as time moves forward.  She will call me if she continues to have these feelings or certainly starts to feel worse.    Anastasio Auerbach MD, 12:01 PM 03/31/2017

## 2017-04-07 ENCOUNTER — Telehealth: Payer: Self-pay | Admitting: *Deleted

## 2017-04-07 NOTE — Telephone Encounter (Signed)
Patient called post TAH states she noticed small amount of blood at incision site. No pain in this area, or color to this area other than the blood. I advised patient to watch this area, if increased bleeding or pain or color discharge to this area to call and schedule OV. Pt verbalized she understood.

## 2017-04-14 ENCOUNTER — Encounter: Payer: Self-pay | Admitting: Gynecology

## 2017-04-28 ENCOUNTER — Telehealth: Payer: Self-pay | Admitting: *Deleted

## 2017-04-28 NOTE — Telephone Encounter (Signed)
Patient post 02/27/17 abdominal hysterectomy, called c/o pulling sensation in abdomen at incision site, states sharp at times, states she moved a heavy box on 04/25/17 and noticed the sensation later that night, notices discomfort when moving in certain position. No urinary issues, lower back discomfort at times. Doesn't note any problems at incision site. 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 Transferred to front desk to schedule.

## 2017-05-01 ENCOUNTER — Encounter: Payer: Self-pay | Admitting: Gynecology

## 2017-05-01 ENCOUNTER — Ambulatory Visit: Payer: BLUE CROSS/BLUE SHIELD | Admitting: Gynecology

## 2017-05-01 VITALS — BP 130/84

## 2017-05-01 DIAGNOSIS — R1030 Lower abdominal pain, unspecified: Secondary | ICD-10-CM

## 2017-05-01 MED ORDER — IBUPROFEN 800 MG PO TABS: 800.0000 mg | ORAL_TABLET | Freq: Three times a day (TID) | ORAL | 2 refills | Status: AC | PRN

## 2017-05-01 NOTE — Progress Notes (Signed)
    Debra Hicks 1970-07-15 [id number]        47 y.o.  G2P0011 presents having undergone TAH bilateral salpingectomies 02/27/2017.  Did well postoperatively until several days ago when she was lifting a footstool she had the onset of lower abdominal discomfort which has persisted.  She is taken ibuprofen but still has discomfort.  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  No nausea vomiting diarrhea constipation.  Otherwise doing well as far as eating drinking and ambulating.  No UTI symptoms.  Past medical history,surgical history, problem list, medications, allergies, family history and social history were all reviewed and documented in the EPIC chart.  Directed ROS with pertinent positives and negatives documented in the history of present illness/assessment and plan.  Exam: Caryn Bee assistant Vitals:   05/01/17 1453  BP: 130/84   General appearance:  Normal Abdomen soft nontender without masses guarding rebound Pelvic external BUS vagina well-healed.  Bimanual without masses or tenderness.  Assessment/Plan:  47 y.o. G2P0011 with reported lower abdominal discomfort after heavy lifting 8 weeks postop status post TAH.  Exam unremarkable.  Will check baseline urine analysis.  Recommend no heavy lifting or straining over the next 2 weeks to allow healing.  Ibuprofen 800 mg every 8 hour for the next several days.  #30 with 1 refills provided.  Heat to the lower back and lower abdomen.  Follow-up if the symptoms persist.  Will arrange work limitations for the next 2 weeks to avoid heavy lifting and straining.    Anastasio Auerbach MD, 3:11 PM 05/01/2017

## 2017-05-02 ENCOUNTER — Other Ambulatory Visit: Payer: Self-pay | Admitting: Gynecology

## 2017-05-02 LAB — URINALYSIS W MICROSCOPIC + REFLEX CULTURE
BILIRUBIN URINE: NEGATIVE
Bacteria, UA: NONE SEEN /HPF
GLUCOSE, UA: NEGATIVE
Hgb urine dipstick: NEGATIVE
Hyaline Cast: NONE SEEN /LPF
KETONES UR: NEGATIVE
LEUKOCYTE ESTERASE: NEGATIVE
NITRITES URINE, INITIAL: NEGATIVE
PH: 7.5 (ref 5.0–8.0)
Protein, ur: NEGATIVE
SPECIFIC GRAVITY, URINE: 1.015 (ref 1.001–1.03)

## 2017-05-02 LAB — NO CULTURE INDICATED

## 2017-05-02 MED ORDER — TRAMADOL HCL 50 MG PO TABS: 50.0000 mg | ORAL_TABLET | Freq: Four times a day (QID) | ORAL | 0 refills | Status: AC | PRN

## 2017-05-02 NOTE — Progress Notes (Signed)
On-call note: Patient was evaluated yesterday due to lower abdominal pain at 8 weeks postop status post TAH bilateral salpingectomies.  Patient was doing well until several days prior when she was lifting a foot stool and she had the onset of lower abdominal discomfort.  She has been using Motrin and heating pad and notes that she still having a lot of discomfort coming around the front of her abdomen into her back.  She sneezed today which caused more discomfort in her lower abdomen.  She is having no issues with nausea vomiting diarrhea or constipation with bowel movements daily.  No issues voiding as far as no frequency dysuria urgency low back pain fever or chills.  Eating regularly and otherwise doing well up until several days ago.  Urine analysis yesterday was negative without blood or leukocytes.  Reviewed with patient I think historically it sounds like she has had some muscle pull with possible incisional strain although her exam yesterday was normal noting no incisional issues and bimanual exam without masses or tenderness.  I discussed with the patient that more serious injuries to include bowel bladder ureters would be unusual at 8 weeks with no symptoms before hand up until several days ago.  Recommended heat, rest and will try Ultram 50 mg every 6 hours as needed pain #30 with no refills.  Also will plan ultrasound of the pelvis just to make sure there is not an ovarian process such as cyst or fluid collection contributing to her discomfort.  She will be called tomorrow during office hours to schedule this.

## 2017-05-03 ENCOUNTER — Telehealth: Payer: Self-pay | Admitting: *Deleted

## 2017-05-03 ENCOUNTER — Other Ambulatory Visit: Payer: Self-pay

## 2017-05-03 ENCOUNTER — Telehealth: Payer: Self-pay

## 2017-05-03 DIAGNOSIS — R1030 Lower abdominal pain, unspecified: Secondary | ICD-10-CM

## 2017-05-03 NOTE — Telephone Encounter (Signed)
ORDER PLACED FRONT DESK TO CALL AND SCHEDULE

## 2017-05-03 NOTE — Telephone Encounter (Signed)
Patient called stating her job is "giving her a hard time".  She said that her 8 weeks out of work after surgery was not technically up until 04/29/17. (Actually 8 weeks was 04/25/17.). Her employer expected her back to work on Jan 3. She said she did not return on the 3rd, 4th or 5th because she "wasn't feeling well with abdominal pain". She did not call us or come in.    She is asking if we can give her a letter to provide employer excusing her for those dates?

## 2017-05-03 NOTE — Telephone Encounter (Signed)
Patient called in voice mail this morning. She thanked Anderson Malta for calling her back and said that she actually spoke with Dr. Loetta Rough last night and it is all taken care of. He sent a prescription for the pain in for her last night.

## 2017-05-03 NOTE — Telephone Encounter (Signed)
Patient called and left stating the ibuprofen is not helping with pain. She asked that I call her, I called and left message asking her to call back and leave a detailed message in voicemail.

## 2017-05-03 NOTE — Telephone Encounter (Signed)
-----   Message from Anastasio Auerbach, MD sent at 05/02/2017  5:53 PM EST ----- Schedule ultrasound hopefully this week or very beginning of next week reference postop abdominal hysterectomy with lower abdominal pain

## 2017-05-03 NOTE — Telephone Encounter (Signed)
Okay for those days.

## 2017-05-04 ENCOUNTER — Telehealth: Payer: Self-pay | Admitting: Obstetrics & Gynecology

## 2017-05-04 NOTE — Telephone Encounter (Signed)
Patient informed. At her request I faxed letter to employer.

## 2017-05-04 NOTE — Telephone Encounter (Signed)
Called by paging service that pt has questions about pain medication.   However, the patient stated her questions have all been answered and she has no new issues.  Apologized for phone call.  Pt very understanding.

## 2017-05-08 ENCOUNTER — Encounter: Payer: Self-pay | Admitting: Gynecology

## 2017-05-08 ENCOUNTER — Ambulatory Visit: Payer: BLUE CROSS/BLUE SHIELD | Admitting: Gynecology

## 2017-05-08 ENCOUNTER — Ambulatory Visit (INDEPENDENT_AMBULATORY_CARE_PROVIDER_SITE_OTHER): Payer: BLUE CROSS/BLUE SHIELD

## 2017-05-08 ENCOUNTER — Telehealth: Payer: Self-pay | Admitting: Obstetrics and Gynecology

## 2017-05-08 ENCOUNTER — Encounter: Payer: Self-pay | Admitting: Obstetrics and Gynecology

## 2017-05-08 ENCOUNTER — Other Ambulatory Visit: Payer: Self-pay | Admitting: Gynecology

## 2017-05-08 VITALS — BP 120/76

## 2017-05-08 DIAGNOSIS — N831 Corpus luteum cyst of ovary, unspecified side: Secondary | ICD-10-CM

## 2017-05-08 DIAGNOSIS — Z09 Encounter for follow-up examination after completed treatment for conditions other than malignant neoplasm: Secondary | ICD-10-CM

## 2017-05-08 DIAGNOSIS — R1030 Lower abdominal pain, unspecified: Secondary | ICD-10-CM

## 2017-05-08 NOTE — Patient Instructions (Signed)
Slowly resume normal activities.  Follow-up the end of February for scheduled annual exam.

## 2017-05-08 NOTE — Progress Notes (Signed)
    Debra Hicks [birth year] [id number]        47 y.o.  G2P0011 presents for ultrasound.  Had TAH bilateral salpingectomies 02/27/2017.  Has done well until around the time to return to work when lifting an object she started having lower abdominal discomfort.  Was seen 05/01/2017 with normal exam and negative urine analysis.  Not having nausea vomiting diarrhea constipation.  No significant voiding issues.  Feels lower abdominal pulling type discomfort when lifting or moving quickly.  Was recommended for ultrasound to rule out nonpalpable abnormalities.  Past medical history,surgical history, problem list, medications, allergies, family history and social history were all reviewed and documented in the EPIC chart.  Directed ROS with pertinent positives and negatives documented in the history of present illness/assessment and plan.  Exam: Vitals:   05/08/17 1146  BP: 120/76   General appearance:  Normal Abdomen soft nontender without masses guarding rebound.  Incision healed nicely  Ultrasound transvaginal status post hysterectomy.  Right ovary with 2 cystic changes consistent with physiologic.  25 x 25 x 23 mm and 23 x 18 mm.  Classic corpus luteal in appearance with peripheral flow.  Left ovary with small follicles.  Cul-de-sac with small amount of free fluid 40 x 39 x 23 mm.   Assessment/Plan:  47 y.o. G2P0011 with lower abdominal discomfort which I feel is musculoskeletal associated with lifting.  I reviewed the findings from the ultrasound with the patient.  I feel its all consistent with physiologic changes without significant abnormalities.  Everything with normal healing from her hysterectomy.  Had been on Ultram.  Recommended she transition to ibuprofen 800 mg every 8 hours as needed which she has at home.  She will slowly resume all normal activities.  She does have her annual exam scheduled with me the end of February and will follow-up for this.    Anastasio Auerbach MD, 11:57 AM  05/08/2017

## 2017-05-08 NOTE — Telephone Encounter (Signed)
TC returned to pt who called via answering service c/o depression.  Described not wanting to get out of bed, and awakening at night and crying.  She specifically denies suicidal or homicidal ideation, but admits she has thought to her self on occasion "Lord, just take me away".  She admits she has had bouts of depression in the past, but "not as bad".  She has been prescribed Xanax, but feels it does nothing for her, so she doesn't take it.    Sx started before her surgery but were mild.  She volunteered that she doesn't feel her recent hysterectomy has anything to do with what she's experiencing now.  She lives alone and feels she has no one to confide in because she doesn't want to be judged by anyone.  She was seen by Dr. Phineas Real today and didn't mention these symptoms to him at all during the visit.    She admits to feeling burdened by some financial setbacks caused by denial of short term disability after her surgery.  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  She accepted a plan to find 10 minutes at least twice daily to do a short walk going forward.  She will also accept a referral to a mental health professional for talk therapy. I agreed to send a note to [facility] asking that the triage nurse reach out to her at work [phone])  to see how she is fairing and to set up the referral.  She made a pact to call back if she feels worse or develops suicidal or homicidal ideation.   18 minutes were spent on the call to this patient.

## 2017-05-09 ENCOUNTER — Telehealth: Payer: Self-pay | Admitting: *Deleted

## 2017-05-09 NOTE — Telephone Encounter (Signed)
Dr.Fontaine, pt called back and stating she had to leave work, her boss told her to leave since she is not feeling well. I did get the message about referral to Marya Amsler she did agree will schedule. Pt said the right side is a burning sensation, I explained if pain is that bad she should go to [facility], she declined stating she doesn't want to wait and risk getting sick from hospital. Please advise

## 2017-05-09 NOTE — Telephone Encounter (Signed)
Requesting a note for today due to leaving work as well.

## 2017-05-09 NOTE — Telephone Encounter (Signed)
Pt informed

## 2017-05-09 NOTE — Telephone Encounter (Signed)
Pt called back, states she spoke with Dr.Haygood last night regarding feeling depressed. (see note) Once again doesn't feel suicidal, just doesn't feel like doing anything at home, or work. Returned to work today and said she has been fighting back tears all morning. Also states the right side pain is still going on, taking Motrin states this makes worse, took last ultram tablet this am. I asked pt did she speak with her PCP regarding any of this depression and she said yes and he was no help at all. I did gave pt several PCP names/number a couple of months ago, but pt states she "couldn't" get in to see anybody. Patient said she doesn't know what else to do regarding pain and depression. Please advise

## 2017-05-09 NOTE — Telephone Encounter (Signed)
Pt called and left message asking me to call her. I called and left message.

## 2017-05-09 NOTE — Telephone Encounter (Signed)
When I examined her she had a normal exam with no elicited pain.  She had a normal ultrasound.  I really do not have anything more to offer her at this point other than continue the Motrin 800 mg, heat and to keep moving forward.  I cannot write her out of work with a normal exam and no demonstrable postoperative complications almost 10 weeks postop with.  The only other thing I could offer at this point would be CT scan of the pelvis to look for any reason for her pain.  If she wants to proceed with that and we can schedule this.

## 2017-05-10 NOTE — Telephone Encounter (Signed)
See telephone encounter on 05/09/17

## 2017-05-11 ENCOUNTER — Telehealth: Payer: Self-pay | Admitting: *Deleted

## 2017-05-11 ENCOUNTER — Encounter: Payer: Self-pay | Admitting: Psychology

## 2017-05-11 ENCOUNTER — Ambulatory Visit: Payer: BLUE CROSS/BLUE SHIELD | Admitting: Psychology

## 2017-05-11 DIAGNOSIS — F321 Major depressive disorder, single episode, moderate: Secondary | ICD-10-CM | POA: Diagnosis not present

## 2017-05-11 DIAGNOSIS — F418 Other specified anxiety disorders: Secondary | ICD-10-CM

## 2017-05-11 NOTE — Telephone Encounter (Signed)
She was not able to see Kathyrn Lass her schedule was booked out until Feb, she saw Buena Irish he is with [facility] as well.

## 2017-05-11 NOTE — Telephone Encounter (Signed)
Pt had appointment this am with counselor, the recommendation was to start on Zoloft or Paxil asked if you are willing to prescribe since you referred? She will to continue to have follow up visit with counselor. Please advise

## 2017-05-11 NOTE — Telephone Encounter (Signed)
I would be glad to but I would like to know the name of the counselor she saw first.

## 2017-05-11 NOTE — Telephone Encounter (Signed)
Okay for Paxil 20 mg daily #30 with refill x12.  Patient should call after 1-2 months in follow-up to see how she is doing.  Patient should call if she is having worsening symptoms of depression or anxiety after starting the medication.

## 2017-05-12 MED ORDER — PAROXETINE HCL 20 MG PO TABS: 20.0000 mg | ORAL_TABLET | Freq: Every day | ORAL | 11 refills | Status: DC

## 2017-05-12 NOTE — Telephone Encounter (Signed)
Pt aware, Rx sent. 

## 2017-05-18 ENCOUNTER — Ambulatory Visit: Payer: BLUE CROSS/BLUE SHIELD | Admitting: Psychology

## 2017-06-01 ENCOUNTER — Other Ambulatory Visit: Payer: Self-pay

## 2017-06-01 ENCOUNTER — Encounter (HOSPITAL_COMMUNITY): Payer: Self-pay | Admitting: *Deleted

## 2017-06-01 ENCOUNTER — Emergency Department (HOSPITAL_COMMUNITY): Payer: BLUE CROSS/BLUE SHIELD

## 2017-06-01 ENCOUNTER — Emergency Department (HOSPITAL_COMMUNITY)
Admission: EM | Admit: 2017-06-01 | Discharge: 2017-06-01 | Disposition: A | Discharge status: Home / Self Care | Payer: BLUE CROSS/BLUE SHIELD | Attending: Emergency Medicine | Admitting: Emergency Medicine

## 2017-06-01 DIAGNOSIS — Z79899 Other long term (current) drug therapy: Secondary | ICD-10-CM | POA: Insufficient documentation

## 2017-06-01 DIAGNOSIS — J4521 Mild intermittent asthma with (acute) exacerbation: Secondary | ICD-10-CM | POA: Insufficient documentation

## 2017-06-01 DIAGNOSIS — Y929 Unspecified place or not applicable: Secondary | ICD-10-CM | POA: Insufficient documentation

## 2017-06-01 DIAGNOSIS — W57XXXA Bitten or stung by nonvenomous insect and other nonvenomous arthropods, initial encounter: Secondary | ICD-10-CM | POA: Insufficient documentation

## 2017-06-01 DIAGNOSIS — S20469A Insect bite (nonvenomous) of unspecified back wall of thorax, initial encounter: Secondary | ICD-10-CM | POA: Insufficient documentation

## 2017-06-01 DIAGNOSIS — Y939 Activity, unspecified: Secondary | ICD-10-CM | POA: Insufficient documentation

## 2017-06-01 DIAGNOSIS — Y999 Unspecified external cause status: Secondary | ICD-10-CM | POA: Insufficient documentation

## 2017-06-01 LAB — CBC WITH DIFFERENTIAL/PLATELET
BASOS ABS: 0 10*3/uL (ref 0.0–0.1)
BASOS PCT: 0 %
EOS ABS: 0.4 10*3/uL (ref 0.0–0.7)
EOS PCT: 5 %
HCT: 35.4 % — ABNORMAL LOW (ref 36.0–46.0)
Hemoglobin: 12.5 g/dL (ref 12.0–15.0)
Lymphocytes Relative: 31 %
Lymphs Abs: 2.4 10*3/uL (ref 0.7–4.0)
MCH: 28.5 pg (ref 26.0–34.0)
MCHC: 35.3 g/dL (ref 30.0–36.0)
MCV: 80.6 fL (ref 78.0–100.0)
MONO ABS: 0.7 10*3/uL (ref 0.1–1.0)
Monocytes Relative: 9 %
NEUTROS ABS: 4.2 10*3/uL (ref 1.7–7.7)
Neutrophils Relative %: 55 %
PLATELETS: 326 10*3/uL (ref 150–400)
RBC: 4.39 MIL/uL (ref 3.87–5.11)
RDW: 14.3 % (ref 11.5–15.5)
WBC: 7.8 10*3/uL (ref 4.0–10.5)

## 2017-06-01 LAB — BASIC METABOLIC PANEL
ANION GAP: 13 (ref 5–15)
BUN: 8 mg/dL (ref 6–20)
CALCIUM: 9 mg/dL (ref 8.9–10.3)
CO2: 21 mmol/L — ABNORMAL LOW (ref 22–32)
CREATININE: 0.91 mg/dL (ref 0.44–1.00)
Chloride: 104 mmol/L (ref 101–111)
GFR calc Af Amer: >60 mL/min (ref >60)
GLUCOSE: 107 mg/dL — AB (ref 65–99)
Potassium: 3.7 mmol/L (ref 3.5–5.1)
Sodium: 138 mmol/L (ref 135–145)

## 2017-06-01 LAB — RAPID STREP SCREEN (MED CTR MEBANE ONLY): STREPTOCOCCUS, GROUP A SCREEN (DIRECT): NEGATIVE

## 2017-06-01 LAB — I-STAT TROPONIN, ED: TROPONIN I, POC: 0 ng/mL (ref 0.00–0.08)

## 2017-06-01 LAB — INFLUENZA PANEL BY PCR (TYPE A & B)
INFLAPCR: NEGATIVE
Influenza B By PCR: NEGATIVE

## 2017-06-01 MED ORDER — BENZONATATE 100 MG PO CAPS: 100.0000 mg | ORAL_CAPSULE | Freq: Three times a day (TID) | ORAL | 0 refills | Status: AC

## 2017-06-01 MED ORDER — TRIAMCINOLONE ACETONIDE 0.1 % EX CREA: 1.0000 "application " | TOPICAL_CREAM | Freq: Two times a day (BID) | CUTANEOUS | 0 refills | Status: AC

## 2017-06-01 MED ORDER — ALBUTEROL SULFATE (2.5 MG/3ML) 0.083% IN NEBU
5.0000 mg | INHALATION_SOLUTION | Freq: Once | RESPIRATORY_TRACT | Status: AC
  Administered 2017-06-01: 5 mg via RESPIRATORY_TRACT
  Filled 2017-06-01: qty 6

## 2017-06-01 MED ORDER — IPRATROPIUM BROMIDE 0.02 % IN SOLN
0.5000 mg | Freq: Once | RESPIRATORY_TRACT | Status: AC
  Administered 2017-06-01: 0.5 mg via RESPIRATORY_TRACT
  Filled 2017-06-01: qty 2.5

## 2017-06-01 MED ORDER — PREDNISONE 20 MG PO TABS
60.0000 mg | ORAL_TABLET | Freq: Once | ORAL | Status: AC
  Administered 2017-06-01: 60 mg via ORAL
  Filled 2017-06-01: qty 3

## 2017-06-01 MED ORDER — HYDROXYZINE HCL 25 MG PO TABS
25.0000 mg | ORAL_TABLET | Freq: Once | ORAL | Status: AC
  Administered 2017-06-01: 25 mg via ORAL
  Filled 2017-06-01: qty 1

## 2017-06-01 MED ORDER — PROMETHAZINE-DM 6.25-15 MG/5ML PO SYRP: 5.0000 mL | ORAL_SOLUTION | Freq: Four times a day (QID) | ORAL | 0 refills | Status: AC | PRN

## 2017-06-01 MED ORDER — HYDROXYZINE HCL 25 MG PO TABS: 25.0000 mg | ORAL_TABLET | Freq: Four times a day (QID) | ORAL | 0 refills | Status: AC

## 2017-06-01 MED ORDER — PREDNISONE 10 MG PO TABS: 20.0000 mg | ORAL_TABLET | Freq: Every day | ORAL | 0 refills | Status: AC

## 2017-06-01 MED ORDER — AEROCHAMBER PLUS FLO-VU SMALL MISC
1.0000 | Freq: Once | Status: AC
  Administered 2017-06-01: 1
  Filled 2017-06-01: qty 1
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
## 2017-06-01 NOTE — ED Notes (Signed)
Pt in gown and placed on monitor.

## 2017-06-01 NOTE — ED Notes (Signed)
ED Provider at bedside. 

## 2017-06-01 NOTE — ED Triage Notes (Signed)
Pt states started coughing Saturday and has increased. Pt reports sore throat Sunday and right side looks more swollen than left, reports headache for three. Pt feels like mouth feels weird, tongue is tingling and she has some swelling under chin.  Pt reports she is breathing more than normal.  She is DOE.  Talking in complete sentences, no drooling. Coughing. Pt started having chest tightness and increased with laying down

## 2017-06-01 NOTE — ED Provider Notes (Signed)
MSE was initiated and I personally evaluated the patient and placed orders (if any) at  11:36 AM on June 01, 2017.  The patient appears stable so that the remainder of the MSE may be completed by another provider.  Patient placed in Quick Look pathway, seen and evaluated.  I was called to evaluate this patient who is room in the fast track section of the ED, due to her complaints of throat and tongue was swelling.  Chief Complaint: Shortness of breath  HPI:   Briefly, patient reports that she had "hives" on her posterior thorax last night, and she felt that she was having difficult to breathing while lying flat, as well as subjective sensation that her neck and throat were swelling.  Patient is a history of asthma, and felt that she was becoming more wheezy.  Patient took her albuterol treatment multiple times last night and this morning.  Patient denies fevers, chills, productive cough, difficulty swallowing, or stridor.  ROS: See HPI above.  Physical Exam:   Gen: No distress  Neuro: Awake and Alert  Skin: Warm    Focused Exam: Normal phonation. No muffled voice sounds. Patient swallows secretions without difficulty. Dentition normal. No lesions of tongue or buccal mucosa. Uvula midline. No asymmetric swelling of the posterior pharynx. Erythema of posterior pharynx. No tonsillar exuduate. No lingual swelling. No induration inferior to tongue. No submandibular tenderness, swelling, or induration.  Tissues of the neck supple. No cervical lymphadenopathy.  No neck induration or swelling.  Lungs are clear to auscultation without wheeze.  Patient has normal respiratory and expiratory effort.  After initial assessment and placement of orders, as patient was deemed to have no organ involvement suggesting anaphylaxis or respiratory compromise at this time, nurse manager suggested that patient be placed back in the waiting room stable.  I feel this is appropriate at this time and remainder of workup  can be completed after these initial treatments.  Initiation of care has begun. The patient has been counseled on the process, plan, and necessity for staying for the completion/evaluation, and the remainder of the medical screening examination    Debra Hicks 06/01/17 [place], Wenda Overland, MD 06/02/17 [phone]

## 2017-06-01 NOTE — Discharge Instructions (Addendum)
Take 2 tablets of prednisone daily with breakfast over the next 5 days started tomorrow.  Take 1 tablet of Atarax every 6 hours as needed for itching related to the rash on your back, which looks consistent with an insect bite.  You can also apply a thin layer of triamcinolone cream to the area 2 times daily.  Please stop using the cream when the rash improves as long-term use can cause thinning of the skin.  Please avoid direct contact with sunlight while you are using the cream because it can cause lightening of the skin.  Use your albuterol inhaler with a spacer every 4 hours, 2 puffs as needed for chest tightness or shortness of breath.  Take 1 tablet of benzonatate every 8 hours as needed for cough.  Please call and schedule a follow-up appointment with your primary care provider within the next week for reevaluation.  You can also take 600 mg of ibuprofen with food or thousand milligrams of Tylenol every 8 hours to help with your headache and pain.  Please make sure to take ibuprofen with food to avoid upsetting your stomach.  Your flu test and strep throat test were negative.  If you develop new or worsening symptoms, including worsening difficulty breathing, chest pain, high fever that does not improve with Tylenol, or other concerning symptoms, please return to the emergency department for re-evaluation.

## 2017-06-01 NOTE — ED Notes (Signed)
Pt given coke to drink and droplet precaution sign placed outside door. Also pt has mask in place.

## 2017-06-03 LAB — CULTURE, GROUP A STREP (THRC)

## 2017-06-21 ENCOUNTER — Encounter: Payer: BLUE CROSS/BLUE SHIELD | Admitting: Gynecology

## 2017-06-21 DIAGNOSIS — Z0289 Encounter for other administrative examinations: Secondary | ICD-10-CM

## 2017-09-07 ENCOUNTER — Telehealth: Payer: Self-pay | Admitting: *Deleted

## 2017-09-07 MED ORDER — PAROXETINE HCL 30 MG PO TABS: 30.0000 mg | ORAL_TABLET | Freq: Every day | ORAL | 2 refills | Status: DC

## 2017-09-07 NOTE — Telephone Encounter (Signed)
Patient called states she has moved to [place], has annual scheduled on 11/17/17 takes Paxil 20 mg and has noticed she is experiencing increased anxiety she doesn't feel depressed, states she has been doing well, but wondered if her dose should be increased? Please advise

## 2017-09-07 NOTE — Telephone Encounter (Signed)
Okay to increase Paxil to 30 mg daily and refill her through July

## 2017-09-07 NOTE — Telephone Encounter (Signed)
Patient aware, Rx sent.  

## 2017-09-13 ENCOUNTER — Telehealth: Payer: Self-pay | Admitting: *Deleted

## 2017-09-13 NOTE — Telephone Encounter (Signed)
(  pt aware you are out of the office) Pt has increased to Paxil 30 mg tablet on 09/07/17. Pt said medication is making her very nausea, states she can't drink water without being nauseated. She asked if maybe something else should be prescribed or any recommendations. Please advise

## 2017-09-14 NOTE — Telephone Encounter (Signed)
2 options would be: 1.  Stay at the 30 mg for now and see if this side effect does not resolve.  Sometimes it is transient. 2.  Go back to 20 mg daily and stay on that for a while and see if her anxiety level decreases recognizing she has gone through a life change with the move and as this settles in her anxiety may resolve and do well to 20 mg.  I would prefer not switching to a different medication as it may come with its own set of side effects.

## 2017-09-15 NOTE — Telephone Encounter (Signed)
She will stay on 30 mg dose for now

## 2017-09-15 NOTE — Telephone Encounter (Signed)
Left message for to call.

## 2017-09-15 NOTE — Telephone Encounter (Signed)
Patient informed. 

## 2017-11-17 ENCOUNTER — Encounter: Payer: BLUE CROSS/BLUE SHIELD | Admitting: Gynecology

## 2017-12-13 ENCOUNTER — Telehealth: Payer: Self-pay | Admitting: *Deleted

## 2017-12-13 MED ORDER — PAROXETINE HCL 10 MG PO TABS: ORAL_TABLET | ORAL | 0 refills | Status: AC

## 2017-12-13 NOTE — Telephone Encounter (Signed)
Patient aware, Rx sent for 10 mg dose.

## 2017-12-13 NOTE — Telephone Encounter (Signed)
There are a variety of ways to do this.  The key though is not stop it abruptly but to wean down.  I would recommend getting a prescription for Paxil 10 mg #30 and start with 2 daily for a week then 1 daily for a week and then 1 every other day for several days and then stop it.

## 2017-12-13 NOTE — Telephone Encounter (Signed)
Patient takes Paxil 30 mg tablet daily, has noticed increase weight gain, would like to try to wean off medication. Asked how she can do this? Please advise

## 2018-01-05 ENCOUNTER — Encounter: Payer: BLUE CROSS/BLUE SHIELD | Admitting: Gynecology

## 2018-01-05 DIAGNOSIS — Z0289 Encounter for other administrative examinations: Secondary | ICD-10-CM

## 2018-04-14 IMAGING — DX DG CHEST 2V
2 series · 2 of 2 positions shown · non-contrast
Comparison: October 03, 2016

CLINICAL DATA: Cough

EXAM:
CHEST  2 VIEW

[w chest pa]
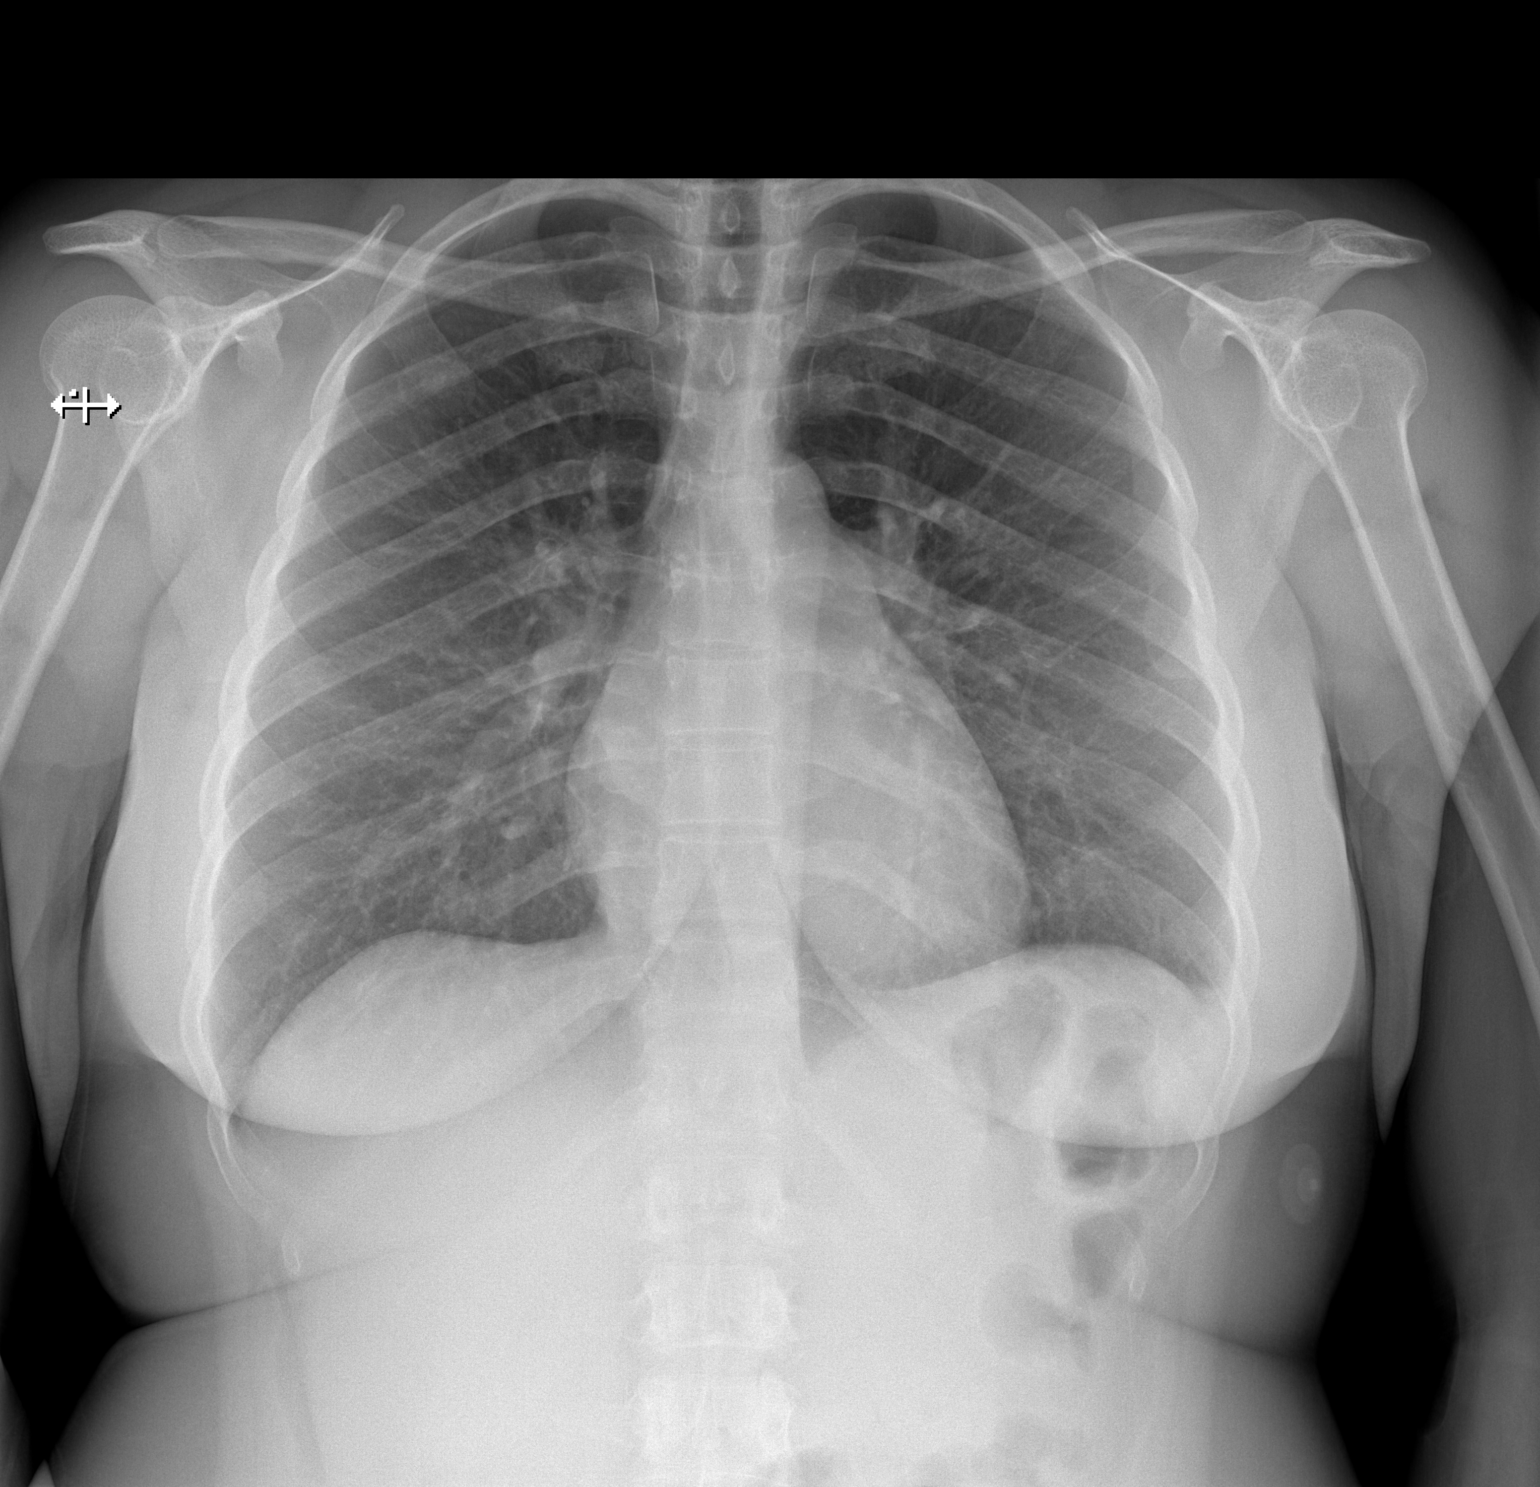

[w chest lat]
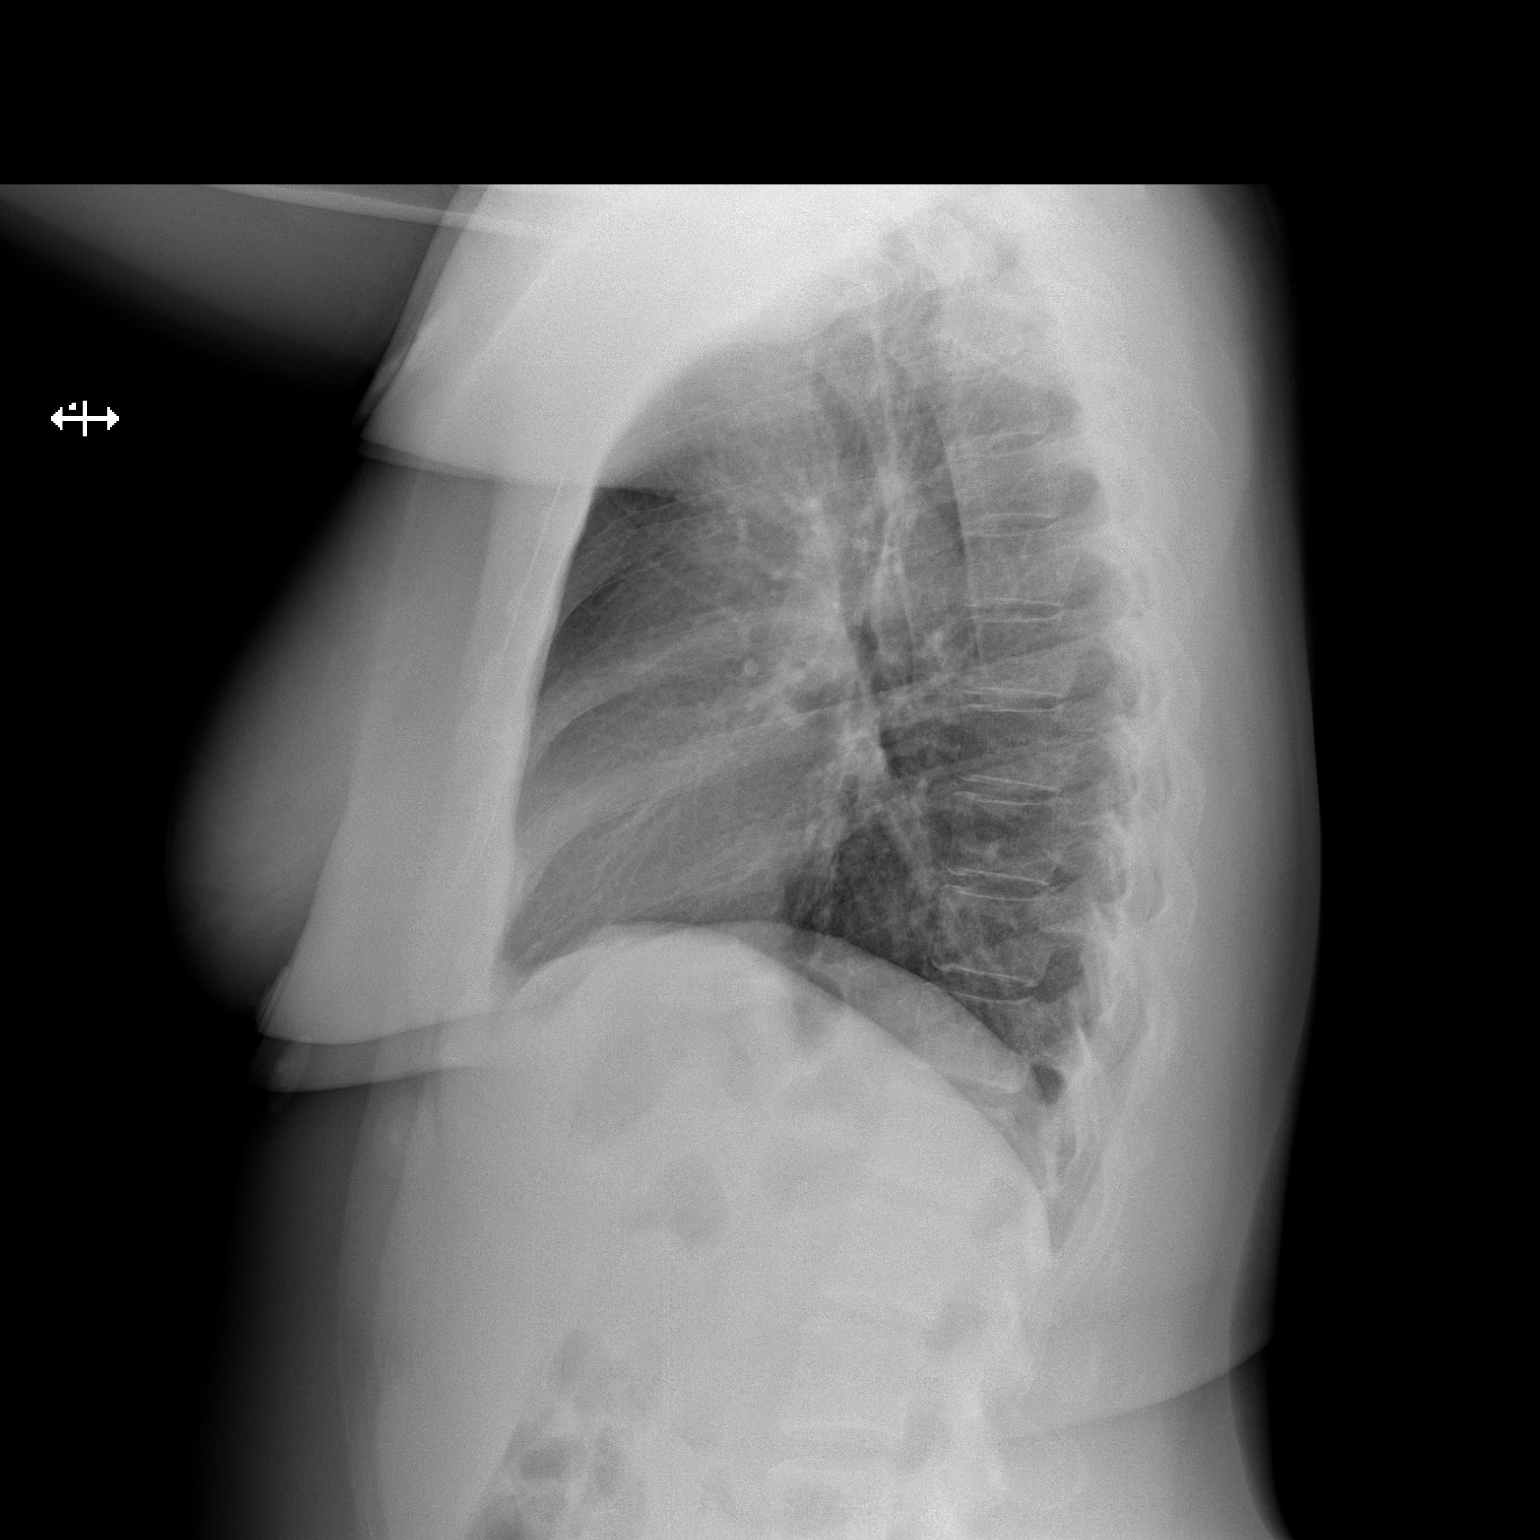

[2 of 2 positions shown; findings below may reference images not displayed]

FINDINGS: The lungs are clear. The heart size and pulmonary vascularity are
normal. No adenopathy. No evident bone lesions.
IMPRESSION: No edema or consolidation.

## 2019-01-15 ENCOUNTER — Encounter: Payer: Self-pay | Admitting: Gynecology

## 2020-03-03 ENCOUNTER — Encounter: Payer: Self-pay | Admitting: Obstetrics & Gynecology
